# Patient Record
Sex: Female | Born: 1990 | Race: Asian | Hispanic: No | Marital: Married | Smoking: Never smoker
Health system: Southern US, Community
[De-identification: ages and names within clinical notes are randomized; demographics above are authoritative.]

## PROBLEM LIST (undated history)

## (undated) DIAGNOSIS — Z789 Other specified health status: Secondary | ICD-10-CM

## (undated) DIAGNOSIS — M069 Rheumatoid arthritis, unspecified: Secondary | ICD-10-CM

## (undated) HISTORY — PX: NO PAST SURGERIES: SHX2092

## (undated) HISTORY — DX: Other specified health status: Z78.9

## (undated) HISTORY — DX: Rheumatoid arthritis, unspecified: M06.9

---
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
## 2009-01-24 ENCOUNTER — Ambulatory Visit (HOSPITAL_COMMUNITY): Admission: RE | Admit: 2009-01-24 | Discharge: 2009-01-24 | Payer: Self-pay | Admitting: Obstetrics & Gynecology

## 2009-03-12 ENCOUNTER — Encounter: Payer: Self-pay | Admitting: Family Medicine

## 2009-03-12 ENCOUNTER — Ambulatory Visit (HOSPITAL_COMMUNITY): Admission: RE | Admit: 2009-03-12 | Discharge: 2009-03-12 | Payer: Self-pay | Admitting: Family Medicine

## 2009-03-17 ENCOUNTER — Ambulatory Visit: Payer: Self-pay | Admitting: Advanced Practice Midwife

## 2009-03-17 ENCOUNTER — Inpatient Hospital Stay (HOSPITAL_COMMUNITY): Admission: AD | Admit: 2009-03-17 | Discharge: 2009-03-17 | Payer: Self-pay | Admitting: Obstetrics & Gynecology

## 2009-03-20 ENCOUNTER — Ambulatory Visit: Payer: Self-pay | Admitting: Obstetrics & Gynecology

## 2009-03-20 ENCOUNTER — Encounter: Payer: Self-pay | Admitting: Obstetrics & Gynecology

## 2009-03-27 ENCOUNTER — Ambulatory Visit (HOSPITAL_COMMUNITY): Admission: RE | Admit: 2009-03-27 | Discharge: 2009-03-27 | Payer: Self-pay | Admitting: Obstetrics & Gynecology

## 2009-03-27 ENCOUNTER — Ambulatory Visit: Payer: Self-pay | Admitting: Obstetrics & Gynecology

## 2009-03-31 ENCOUNTER — Ambulatory Visit: Payer: Self-pay | Admitting: Obstetrics and Gynecology

## 2009-03-31 ENCOUNTER — Inpatient Hospital Stay (HOSPITAL_COMMUNITY): Admission: AD | Admit: 2009-03-31 | Discharge: 2009-04-02 | Payer: Self-pay | Admitting: Obstetrics & Gynecology

## 2009-03-31 ENCOUNTER — Encounter: Payer: Self-pay | Admitting: Obstetrics & Gynecology

## 2010-02-01 ENCOUNTER — Encounter: Payer: Self-pay | Admitting: *Deleted

## 2010-04-06 LAB — COMPREHENSIVE METABOLIC PANEL
Albumin: 3.3 g/dL — ABNORMAL LOW (ref 3.5–5.2)
BUN: 8 mg/dL (ref 6–23)
CO2: 21 mEq/L (ref 19–32)
Chloride: 105 mEq/L (ref 96–112)
Creatinine, Ser: 0.47 mg/dL (ref 0.4–1.2)
GFR calc Af Amer: >60 mL/min (ref >60)
Sodium: 134 mEq/L — ABNORMAL LOW (ref 135–145)

## 2010-04-06 LAB — POCT URINALYSIS DIP (DEVICE)
Bilirubin Urine: NEGATIVE
Glucose, UA: NEGATIVE mg/dL
Hgb urine dipstick: NEGATIVE
Hgb urine dipstick: NEGATIVE
Ketones, ur: NEGATIVE mg/dL
Nitrite: NEGATIVE
Protein, ur: NEGATIVE mg/dL
Protein, ur: NEGATIVE mg/dL
Specific Gravity, Urine: 1.015 (ref 1.005–1.030)
Urobilinogen, UA: 0.2 mg/dL (ref 0.0–1.0)
pH: 7 (ref 5.0–8.0)

## 2010-04-06 LAB — CBC
HCT: 35.2 % — ABNORMAL LOW (ref 36.0–46.0)
MCHC: 34.5 g/dL (ref 30.0–36.0)
MCV: 94.2 fL (ref 78.0–100.0)
Platelets: 157 10*3/uL (ref 150–400)
RBC: 3.73 MIL/uL — ABNORMAL LOW (ref 3.87–5.11)
RDW: 13.9 % (ref 11.5–15.5)
RDW: 14.1 % (ref 11.5–15.5)
WBC: 9 10*3/uL (ref 4.0–10.5)

## 2010-04-06 LAB — URIC ACID: Uric Acid, Serum: 5.5 mg/dL (ref 2.4–7.0)

## 2010-04-27 LAB — POCT URINALYSIS DIP (DEVICE)
Bilirubin Urine: NEGATIVE
Glucose, UA: NEGATIVE mg/dL
Nitrite: NEGATIVE

## 2010-04-27 LAB — WET PREP, GENITAL
Trich, Wet Prep: NONE SEEN
Yeast Wet Prep HPF POC: NONE SEEN

## 2010-04-27 LAB — GC/CHLAMYDIA PROBE AMP, GENITAL: Chlamydia, DNA Probe: NEGATIVE

## 2010-04-27 LAB — POCT PREGNANCY, URINE: Preg Test, Ur: NEGATIVE

## 2010-05-29 NOTE — Op Note (Signed)
Victoria Fitzpatrick, SEM NO.:  [id number]   MEDICAL RECORD NO.:  [id number]          PATIENT TYPE:  AMB   LOCATION:  DSC                          FACILITY:  [facility]   PHYSICIAN:  Leonia Corona, M.D.  DATE OF BIRTH:  1990/08/12   DATE OF PROCEDURE:  05/19/2005  DATE OF DISCHARGE:                                 OPERATIVE REPORT   PREOPERATIVE DIAGNOSIS:  Nonresolving cervical lymphadenitis.   POSTOPERATIVE DIAGNOSIS:  Nonresolving cervical lymphadenitis.   OPERATION PERFORMED:  Diagnostic lymph node biopsy from right cervical  region.   SURGEON:  Leonia Corona, M.D.   ANESTHESIA:  General laryngeal mask.   ASSISTANT:  Nurse.   INDICATIONS FOR PROCEDURE:  This 20 year old girl recently admitted and was  seen for nonresolving right cervical lymphadenitis. There were clumps of  lymph nodes in the lower right cervical chain.  Suspicion of tuberculosis  was made, hence the indication for the procedure.   DESCRIPTION OF PROCEDURE:  The patient was brought to the operating room and  placed supine on the operating table. General laryngeal mask anesthesia was  given.  Rolled sheet was placed under the shoulder to make the right  cervical lymph node prominent.  The neck was turned to the left side.  The  area was cleaned, prepped and draped in usual manner.  Transverse skin  crease incision right above the palpable lymph node in the lower cervical  chain was made measuring about 2 cm.  The incision was deepened through the  subcutaneous tissue and the platysma.  The sternocleidomastoid muscle was  retracted anteriorly and to get to the lymph node which was carefully  dissected, there appeared to be dense periadenitis.  Careful dissection was  carried out.  The multiple lymph nodes were matted together.  During the  procedure, the lymph node started caseating and caseation material came out  and swabs were obtained for aerobic cultures.  Blunt and sharp dissection  was carried out carefully close to the lymph node and two to three lymph  nodes with multiple fragments were taken out and removed from the field,  sent for biopsy and there was no bleeding or oozing in the wound which was  irrigated and cleaned and dry.  Approximately 5 mL of 0.25% Marcaine with  epinephrine was infiltrated in and around the incision for postoperative  pain control.  The wound was closed in two layers, the deep platysmal layer  using 5-0 Vicryl and the  skin with 5-0 Prolene pull-thru stitch.  Steri-Strips were applied which was  covered with sterile gauze and Tegaderm dressing.  The patient tolerated the  procedure very well, which was smooth and uneventful.  The patient was later  extubated and transported to recovery room in good stable condition.      Leonia Corona, M.D.  Electronically Signed     SF/MEDQ  D:  05/19/2005  T:  05/20/2005  Job:  [id number]   cc:   Marthenia Rolling, MD  [address] [place], [place] [address]

## 2013-01-11 NOTE — L&D Delivery Note (Signed)
Delivery Note  At 5:05 PM a viable female was delivered via  (Presentation: LOA with compound left hand and nuchal cord ;  ).  APGAR: 9, 9; weight  .   Placenta status: Intact, Spontaneous.  Cord:  with the following complications: .  Cord pH: NA  Anesthesia: Epidural Episiotomy:  None   Lacerations:  Second degree midline perineal Suture Repair: 3.0 vicryl Est. Blood Loss (mL): 350    Mom to postpartum.  Baby to Couplet care / Skin to Skin.  Tawnya Crook 01/07/2014, 5:41 PM

## 2013-05-22 ENCOUNTER — Encounter: Payer: Self-pay | Admitting: *Deleted

## 2013-05-22 ENCOUNTER — Ambulatory Visit (INDEPENDENT_AMBULATORY_CARE_PROVIDER_SITE_OTHER): Payer: Self-pay | Admitting: *Deleted

## 2013-05-22 DIAGNOSIS — Z3201 Encounter for pregnancy test, result positive: Secondary | ICD-10-CM

## 2013-05-22 DIAGNOSIS — Z3200 Encounter for pregnancy test, result unknown: Secondary | ICD-10-CM

## 2013-05-22 LAB — POCT PREGNANCY, URINE: Preg Test, Ur: POSITIVE — AB

## 2013-05-22 NOTE — Progress Notes (Signed)
Pregnancy test:  POSITIVE, letter of verification given, LMP 04/22/2013 [facility] 01/27/2014.  Pt is unsure if she will receive care at this clinic, needs to apply for Medicaid.

## 2013-08-15 ENCOUNTER — Encounter: Payer: Self-pay | Admitting: Family

## 2013-08-15 ENCOUNTER — Ambulatory Visit (INDEPENDENT_AMBULATORY_CARE_PROVIDER_SITE_OTHER): Payer: Medicaid Other | Admitting: Family

## 2013-08-15 ENCOUNTER — Other Ambulatory Visit (HOSPITAL_COMMUNITY)
Admission: RE | Admit: 2013-08-15 | Discharge: 2013-08-15 | Disposition: A | Discharge status: Home / Self Care | Payer: Medicaid Other | Source: Ambulatory Visit | Attending: Family | Admitting: Family

## 2013-08-15 VITALS — BP 120/69 | HR 79 | Temp 98.4°F | Ht 60.0 in | Wt 107.5 lb

## 2013-08-15 DIAGNOSIS — Z01419 Encounter for gynecological examination (general) (routine) without abnormal findings: Secondary | ICD-10-CM | POA: Insufficient documentation

## 2013-08-15 DIAGNOSIS — Z3482 Encounter for supervision of other normal pregnancy, second trimester: Secondary | ICD-10-CM

## 2013-08-15 DIAGNOSIS — Z113 Encounter for screening for infections with a predominantly sexual mode of transmission: Secondary | ICD-10-CM | POA: Diagnosis present

## 2013-08-15 DIAGNOSIS — Z3490 Encounter for supervision of normal pregnancy, unspecified, unspecified trimester: Secondary | ICD-10-CM | POA: Insufficient documentation

## 2013-08-15 DIAGNOSIS — Z3480 Encounter for supervision of other normal pregnancy, unspecified trimester: Secondary | ICD-10-CM

## 2013-08-15 LAB — POCT URINALYSIS DIP (DEVICE)
Bilirubin Urine: NEGATIVE
GLUCOSE, UA: NEGATIVE mg/dL
Hgb urine dipstick: NEGATIVE
Nitrite: NEGATIVE
Protein, ur: NEGATIVE mg/dL
Specific Gravity, Urine: 1.02 (ref 1.005–1.030)
UROBILINOGEN UA: 0.2 mg/dL (ref 0.0–1.0)
pH: 5.5 (ref 5.0–8.0)

## 2013-08-15 MED ORDER — PRENATAL VITAMINS 0.8 MG PO TABS: 1.0000 | ORAL_TABLET | Freq: Every day | ORAL | Status: DC

## 2013-08-15 NOTE — Patient Instructions (Signed)
Second Trimester of Pregnancy The second trimester is from week 13 through week 28, months 4 through 6. The second trimester is often a time when you feel your best. Your body has also adjusted to being pregnant, and you begin to feel better physically. Usually, morning sickness has lessened or quit completely, you may have more energy, and you may have an increase in appetite. The second trimester is also a time when the fetus is growing rapidly. At the end of the sixth month, the fetus is about 9 inches long and weighs about 1 pounds. You will likely begin to feel the baby move (quickening) between 18 and 20 weeks of the pregnancy. BODY CHANGES Your body goes through many changes during pregnancy. The changes vary from woman to woman.   Your weight will continue to increase. You will notice your lower abdomen bulging out.  You may begin to get stretch marks on your hips, abdomen, and breasts.  You may develop headaches that can be relieved by medicines approved by your health care provider.  You may urinate more often because the fetus is pressing on your bladder.  You may develop or continue to have heartburn as a result of your pregnancy.  You may develop constipation because certain hormones are causing the muscles that push waste through your intestines to slow down.  You may develop hemorrhoids or swollen, bulging veins (varicose veins).  You may have back pain because of the weight gain and pregnancy hormones relaxing your joints between the bones in your pelvis and as a result of a shift in weight and the muscles that support your balance.  Your breasts will continue to grow and be tender.  Your gums may bleed and may be sensitive to brushing and flossing.  Dark spots or blotches (chloasma, mask of pregnancy) may develop on your face. This will likely fade after the baby is born.  A dark line from your belly button to the pubic area (linea nigra) may appear. This will likely fade  after the baby is born.  You may have changes in your hair. These can include thickening of your hair, rapid growth, and changes in texture. Some women also have hair loss during or after pregnancy, or hair that feels dry or thin. Your hair will most likely return to normal after your baby is born. WHAT TO EXPECT AT YOUR PRENATAL VISITS During a routine prenatal visit:  You will be weighed to make sure you and the fetus are growing normally.  Your blood pressure will be taken.  Your abdomen will be measured to track your baby's growth.  The fetal heartbeat will be listened to.  Any test results from the previous visit will be discussed. Your health care provider may ask you:  How you are feeling.  If you are feeling the baby move.  If you have had any abnormal symptoms, such as leaking fluid, bleeding, severe headaches, or abdominal cramping.  If you have any questions. Other tests that may be performed during your second trimester include:  Blood tests that check for:  Low iron levels (anemia).  Gestational diabetes (between 24 and 28 weeks).  Rh antibodies.  Urine tests to check for infections, diabetes, or protein in the urine.  An ultrasound to confirm the proper growth and development of the baby.  An amniocentesis to check for possible genetic problems.  Fetal screens for spina bifida and Down syndrome. HOME CARE INSTRUCTIONS   Avoid all smoking, herbs, alcohol, and unprescribed   drugs. These chemicals affect the formation and growth of the baby.  Follow your health care provider's instructions regarding medicine use. There are medicines that are either safe or unsafe to take during pregnancy.  Exercise only as directed by your health care provider. Experiencing uterine cramps is a good sign to stop exercising.  Continue to eat regular, healthy meals.  Wear a good support bra for breast tenderness.  Do not use hot tubs, steam rooms, or saunas.  Wear your  seat belt at all times when driving.  Avoid raw meat, uncooked cheese, cat litter boxes, and soil used by cats. These carry germs that can cause birth defects in the baby.  Take your prenatal vitamins.  Try taking a stool softener (if your health care provider approves) if you develop constipation. Eat more high-fiber foods, such as fresh vegetables or fruit and whole grains. Drink plenty of fluids to keep your urine clear or pale yellow.  Take warm sitz baths to soothe any pain or discomfort caused by hemorrhoids. Use hemorrhoid cream if your health care provider approves.  If you develop varicose veins, wear support hose. Elevate your feet for 15 minutes, 3-4 times a day. Limit salt in your diet.  Avoid heavy lifting, wear low heel shoes, and practice good posture.  Rest with your legs elevated if you have leg cramps or low back pain.  Visit your dentist if you have not gone yet during your pregnancy. Use a soft toothbrush to brush your teeth and be gentle when you floss.  A sexual relationship may be continued unless your health care provider directs you otherwise.  Continue to go to all your prenatal visits as directed by your health care provider. SEEK MEDICAL CARE IF:   You have dizziness.  You have mild pelvic cramps, pelvic pressure, or nagging pain in the abdominal area.  You have persistent nausea, vomiting, or diarrhea.  You have a bad smelling vaginal discharge.  You have pain with urination. SEEK IMMEDIATE MEDICAL CARE IF:   You have a fever.  You are leaking fluid from your vagina.  You have spotting or bleeding from your vagina.  You have severe abdominal cramping or pain.  You have rapid weight gain or loss.  You have shortness of breath with chest pain.  You notice sudden or extreme swelling of your face, hands, ankles, feet, or legs.  You have not felt your baby move in over an hour.  You have severe headaches that do not go away with  medicine.  You have vision changes. Document Released: 12/22/2000 Document Revised: 01/02/2013 Document Reviewed: 02/29/2012 Exit[employer] Patient Information 2015 [employer]. This information is not intended to replace advice given to you by your health care provider. Make sure you discuss any questions you have with your health care provider.  

## 2013-08-15 NOTE — Progress Notes (Signed)
   Subjective:    Victoria Fitzpatrick is a G2P1001 [redacted]w[redacted]d being seen today for her first obstetrical visit.   Patient does intend to breast feed. Pregnancy history fully reviewed.  Patient reports no complaints.  Filed Vitals:   08/15/13 1343 08/15/13 1346  BP: 120/69   Pulse: 79   Temp: 98.4 F (36.9 C)   Height:  5' (1.524 m)  Weight: 107 lb 8 oz (48.762 kg)     HISTORY: OB History  Gravida Para Term Preterm AB SAB TAB Ectopic Multiple Living  2 1 1       1     # Outcome Date GA Lbr Len/2nd Weight Sex Delivery Anes PTL Lv  2 CUR           1 TRM 03/31/09 [redacted]w[redacted]d  4 lb 8 oz (2.041 kg) M VAC None N Y     Comments: No complications     Past Medical History  Diagnosis Date  . Medical history non-contributory    Past Surgical History  Procedure Laterality Date  . No past surgeries     History reviewed. No pertinent family history.   Exam     Exam   BP 120/69  Pulse 79  Temp(Src) 98.4 F (36.9 C)  Ht 5' (1.524 m)  Wt 107 lb 8 oz (48.762 kg)  BMI 20.99 kg/m2  LMP 04/19/2013 Uterine Size: size equals dates  Pelvic Exam:    Perineum: No Hemorrhoids, Normal Perineum   Vulva: normal   Vagina:  normal mucosa, yellow-greenish vaginal discharge, no palpable nodules   pH: Not done   Cervix: Scant amount following Pap, no cervical motion tenderness and no lesions   Adnexa: normal adnexa and no mass, fullness, tenderness   Bony Pelvis: Adequate  System: Breast:  No nipple retraction or dimpling, No nipple discharge or bleeding, No axillary or supraclavicular adenopathy, Normal to palpation without dominant masses   Skin: normal coloration and turgor, no rashes    Neurologic: negative   Extremities: normal strength, tone, and muscle mass   HEENT neck supple with midline trachea and thyroid without masses   Mouth/Teeth mucous membranes moist, pharynx normal without lesions   Neck supple and no masses   Cardiovascular: regular rate and rhythm, no murmurs or gallops   Respiratory:  appears well, vitals normal, no respiratory distress, acyanotic, normal RR, neck free of mass or lymphadenopathy, chest clear, no wheezing, crepitations, rhonchi, normal symmetric air entry   Abdomen: soft, non-tender; bowel sounds normal; no masses,  no organomegaly   Urinary: urethral meatus normal    Assessment:    Pregnancy:  23 yo G2P1001 at [redacted]w[redacted]d wks IUP Patient Active Problem List   Diagnosis Date Noted  . Supervision of normal pregnancy, antepartum 08/15/2013  Abnormal Vaginal Discharge      Plan:     Initial labs drawn. Wet prep collected; Pap smear with GC/CT Prenatal vitamins. Problem list reviewed and updated. Genetic Screening discussed Quad Screen: ordered.  Ultrasound discussed; fetal survey: ordered.  Follow up in 4 weeks.    10/15/2013 08/15/2013

## 2013-08-16 LAB — WET PREP, GENITAL
CLUE CELLS WET PREP: NONE SEEN
Trich, Wet Prep: NONE SEEN
WBC, Wet Prep HPF POC: NONE SEEN
Yeast Wet Prep HPF POC: NONE SEEN

## 2013-08-16 LAB — OBSTETRIC PANEL
Antibody Screen: NEGATIVE
BASOS PCT: 0 % (ref 0–1)
Basophils Absolute: 0 10*3/uL (ref 0.0–0.1)
Eosinophils Absolute: 0 10*3/uL (ref 0.0–0.7)
Eosinophils Relative: 0 % (ref 0–5)
HCT: 39.6 % (ref 36.0–46.0)
HEP B S AG: NEGATIVE
Hemoglobin: 13.4 g/dL (ref 12.0–15.0)
LYMPHS PCT: 21 % (ref 12–46)
Lymphs Abs: 1.5 10*3/uL (ref 0.7–4.0)
MCH: 29.1 pg (ref 26.0–34.0)
MCHC: 33.8 g/dL (ref 30.0–36.0)
MCV: 86.1 fL (ref 78.0–100.0)
MONOS PCT: 8 % (ref 3–12)
Monocytes Absolute: 0.6 10*3/uL (ref 0.1–1.0)
NEUTROS ABS: 5.2 10*3/uL (ref 1.7–7.7)
NEUTROS PCT: 71 % (ref 43–77)
Platelets: 240 10*3/uL (ref 150–400)
RBC: 4.6 MIL/uL (ref 3.87–5.11)
RDW: 14.1 % (ref 11.5–15.5)
RH TYPE: POSITIVE
Rubella: 11.3 Index — ABNORMAL HIGH (ref <0.90)
WBC: 7.3 10*3/uL (ref 4.0–10.5)

## 2013-08-16 LAB — AFP, QUAD SCREEN
AFP: 44.5 [IU]/mL
Age Alone: 1:1090 {titer}
Curr Gest Age: 16.6 wks.days
Down Syndrome Scr Risk Est: <1:38500 {titer}
HCG, Total: 33393 m[IU]/mL
INH: 137 pg/mL
INTERPRETATION-AFP: NEGATIVE
MoM for AFP: 1.17
MoM for INH: 0.65
MoM for hCG: 1.44
OPEN SPINA BIFIDA: NEGATIVE
Osb Risk: 1:7440 {titer}
Tri 18 Scr Risk Est: NEGATIVE
Trisomy 18 (Edward) Syndrome Interp.: <1:113000 {titer}
UE3 MOM: 1.51
UE3 VALUE: 1 ng/mL

## 2013-08-16 LAB — CYTOLOGY - PAP

## 2013-08-16 LAB — HIV ANTIBODY (ROUTINE TESTING W REFLEX): HIV 1&2 Ab, 4th Generation: NONREACTIVE

## 2013-08-17 LAB — HEMOGLOBINOPATHY EVALUATION
HGB S QUANTITAION: 0 %
Hemoglobin Other: 0 %
Hgb A2 Quant: 2.9 % (ref 2.2–3.2)
Hgb A: 97.1 % (ref 96.8–97.8)
Hgb F Quant: 0 % (ref 0.0–2.0)

## 2013-08-17 LAB — PRESCRIPTION MONITORING PROFILE (19 PANEL)
Amphetamine/Meth: NEGATIVE ng/mL
BARBITURATE SCREEN, URINE: NEGATIVE ng/mL
Benzodiazepine Screen, Urine: NEGATIVE ng/mL
Buprenorphine, Urine: NEGATIVE ng/mL
CANNABINOID SCRN UR: NEGATIVE ng/mL
CREATININE, URINE: 122.2 mg/dL (ref >20.0)
Carisoprodol, Urine: NEGATIVE ng/mL
Cocaine Metabolites: NEGATIVE ng/mL
FENTANYL URINE: NEGATIVE ng/mL
MDMA URINE: NEGATIVE ng/mL
MEPERIDINE UR: NEGATIVE ng/mL
Methadone Screen, Urine: NEGATIVE ng/mL
Methaqualone: NEGATIVE ng/mL
Nitrites, Initial: NEGATIVE ug/mL
OPIATE SCREEN, URINE: NEGATIVE ng/mL
OXYCODONE SCRN UR: NEGATIVE ng/mL
PHENCYCLIDINE, UR: NEGATIVE ng/mL
Propoxyphene: NEGATIVE ng/mL
TRAMADOL UR: NEGATIVE ng/mL
Tapentadol, urine: NEGATIVE ng/mL
Zolpidem, Urine: NEGATIVE ng/mL
pH, Initial: 6 pH (ref 4.5–8.9)

## 2013-08-17 LAB — CULTURE, OB URINE
Colony Count: NO GROWTH
ORGANISM ID, BACTERIA: NO GROWTH

## 2013-08-19 ENCOUNTER — Encounter: Payer: Self-pay | Admitting: Family

## 2013-08-30 ENCOUNTER — Ambulatory Visit (HOSPITAL_COMMUNITY)
Admission: RE | Admit: 2013-08-30 | Discharge: 2013-08-30 | Disposition: A | Discharge status: Home / Self Care | Payer: Medicaid Other | Source: Ambulatory Visit | Attending: Family | Admitting: Family

## 2013-08-30 DIAGNOSIS — Z363 Encounter for antenatal screening for malformations: Secondary | ICD-10-CM | POA: Insufficient documentation

## 2013-08-30 DIAGNOSIS — Z1389 Encounter for screening for other disorder: Secondary | ICD-10-CM | POA: Diagnosis present

## 2013-08-30 DIAGNOSIS — Z3482 Encounter for supervision of other normal pregnancy, second trimester: Secondary | ICD-10-CM

## 2013-09-12 ENCOUNTER — Ambulatory Visit (INDEPENDENT_AMBULATORY_CARE_PROVIDER_SITE_OTHER): Payer: Medicaid Other | Admitting: Advanced Practice Midwife

## 2013-09-12 ENCOUNTER — Encounter: Payer: Self-pay | Admitting: Advanced Practice Midwife

## 2013-09-12 VITALS — BP 103/63 | HR 78 | Temp 97.4°F | Wt 112.7 lb

## 2013-09-12 DIAGNOSIS — Z3482 Encounter for supervision of other normal pregnancy, second trimester: Secondary | ICD-10-CM

## 2013-09-12 DIAGNOSIS — Z3480 Encounter for supervision of other normal pregnancy, unspecified trimester: Secondary | ICD-10-CM

## 2013-09-12 LAB — POCT URINALYSIS DIP (DEVICE)
Bilirubin Urine: NEGATIVE
Glucose, UA: NEGATIVE mg/dL
Hgb urine dipstick: NEGATIVE
KETONES UR: NEGATIVE mg/dL
Nitrite: NEGATIVE
PH: 7 (ref 5.0–8.0)
Protein, ur: NEGATIVE mg/dL
SPECIFIC GRAVITY, URINE: 1.015 (ref 1.005–1.030)
Urobilinogen, UA: 0.2 mg/dL (ref 0.0–1.0)

## 2013-09-12 MED ORDER — PRENATAL VITAMINS 0.8 MG PO TABS: 1.0000 | ORAL_TABLET | Freq: Every day | ORAL | Status: DC

## 2013-09-12 NOTE — Progress Notes (Signed)
Patient is feeling well with no complaints.  No bleeding/pain/contractions.  Prescription printed for PNV.  [place] reviewed with normal anatomy.

## 2013-09-12 NOTE — Progress Notes (Signed)
Seen also by me. Printed Rx for vitamins

## 2013-09-12 NOTE — Patient Instructions (Signed)
Second Trimester of Pregnancy The second trimester is from week 13 through week 28, months 4 through 6. The second trimester is often a time when you feel your best. Your body has also adjusted to being pregnant, and you begin to feel better physically. Usually, morning sickness has lessened or quit completely, you may have more energy, and you may have an increase in appetite. The second trimester is also a time when the fetus is growing rapidly. At the end of the sixth month, the fetus is about 9 inches long and weighs about 1 pounds. You will likely begin to feel the baby move (quickening) between 18 and 20 weeks of the pregnancy. BODY CHANGES Your body goes through many changes during pregnancy. The changes vary from woman to woman.   Your weight will continue to increase. You will notice your lower abdomen bulging out.  You may begin to get stretch marks on your hips, abdomen, and breasts.  You may develop headaches that can be relieved by medicines approved by your health care provider.  You may urinate more often because the fetus is pressing on your bladder.  You may develop or continue to have heartburn as a result of your pregnancy.  You may develop constipation because certain hormones are causing the muscles that push waste through your intestines to slow down.  You may develop hemorrhoids or swollen, bulging veins (varicose veins).  You may have back pain because of the weight gain and pregnancy hormones relaxing your joints between the bones in your pelvis and as a result of a shift in weight and the muscles that support your balance.  Your breasts will continue to grow and be tender.  Your gums may bleed and may be sensitive to brushing and flossing.  Dark spots or blotches (chloasma, mask of pregnancy) may develop on your face. This will likely fade after the baby is born.  A dark line from your belly button to the pubic area (linea nigra) may appear. This will likely fade  after the baby is born.  You may have changes in your hair. These can include thickening of your hair, rapid growth, and changes in texture. Some women also have hair loss during or after pregnancy, or hair that feels dry or thin. Your hair will most likely return to normal after your baby is born. WHAT TO EXPECT AT YOUR PRENATAL VISITS During a routine prenatal visit:  You will be weighed to make sure you and the fetus are growing normally.  Your blood pressure will be taken.  Your abdomen will be measured to track your baby's growth.  The fetal heartbeat will be listened to.  Any test results from the previous visit will be discussed. Your health care provider may ask you:  How you are feeling.  If you are feeling the baby move.  If you have had any abnormal symptoms, such as leaking fluid, bleeding, severe headaches, or abdominal cramping.  If you have any questions. Other tests that may be performed during your second trimester include:  Blood tests that check for:  Low iron levels (anemia).  Gestational diabetes (between 24 and 28 weeks).  Rh antibodies.  Urine tests to check for infections, diabetes, or protein in the urine.  An ultrasound to confirm the proper growth and development of the baby.  An amniocentesis to check for possible genetic problems.  Fetal screens for spina bifida and Down syndrome. HOME CARE INSTRUCTIONS   Avoid all smoking, herbs, alcohol, and unprescribed   drugs. These chemicals affect the formation and growth of the baby.  Follow your health care provider's instructions regarding medicine use. There are medicines that are either safe or unsafe to take during pregnancy.  Exercise only as directed by your health care provider. Experiencing uterine cramps is a good sign to stop exercising.  Continue to eat regular, healthy meals.  Wear a good support bra for breast tenderness.  Do not use hot tubs, steam rooms, or saunas.  Wear your  seat belt at all times when driving.  Avoid raw meat, uncooked cheese, cat litter boxes, and soil used by cats. These carry germs that can cause birth defects in the baby.  Take your prenatal vitamins.  Try taking a stool softener (if your health care provider approves) if you develop constipation. Eat more high-fiber foods, such as fresh vegetables or fruit and whole grains. Drink plenty of fluids to keep your urine clear or pale yellow.  Take warm sitz baths to soothe any pain or discomfort caused by hemorrhoids. Use hemorrhoid cream if your health care provider approves.  If you develop varicose veins, wear support hose. Elevate your feet for 15 minutes, 3-4 times a day. Limit salt in your diet.  Avoid heavy lifting, wear low heel shoes, and practice good posture.  Rest with your legs elevated if you have leg cramps or low back pain.  Visit your dentist if you have not gone yet during your pregnancy. Use a soft toothbrush to brush your teeth and be gentle when you floss.  A sexual relationship may be continued unless your health care provider directs you otherwise.  Continue to go to all your prenatal visits as directed by your health care provider. SEEK MEDICAL CARE IF:   You have dizziness.  You have mild pelvic cramps, pelvic pressure, or nagging pain in the abdominal area.  You have persistent nausea, vomiting, or diarrhea.  You have a bad smelling vaginal discharge.  You have pain with urination. SEEK IMMEDIATE MEDICAL CARE IF:   You have a fever.  You are leaking fluid from your vagina.  You have spotting or bleeding from your vagina.  You have severe abdominal cramping or pain.  You have rapid weight gain or loss.  You have shortness of breath with chest pain.  You notice sudden or extreme swelling of your face, hands, ankles, feet, or legs.  You have not felt your baby move in over an hour.  You have severe headaches that do not go away with  medicine.  You have vision changes. Document Released: 12/22/2000 Document Revised: 01/02/2013 Document Reviewed: 02/29/2012 Exit[employer] Patient Information 2015 [employer]. This information is not intended to replace advice given to you by your health care provider. Make sure you discuss any questions you have with your health care provider.  

## 2013-09-15 ENCOUNTER — Encounter: Payer: Self-pay | Admitting: Family

## 2013-10-10 ENCOUNTER — Encounter: Payer: Self-pay | Admitting: Obstetrics & Gynecology

## 2013-10-10 ENCOUNTER — Ambulatory Visit (INDEPENDENT_AMBULATORY_CARE_PROVIDER_SITE_OTHER): Payer: Medicaid Other | Admitting: Advanced Practice Midwife

## 2013-10-10 VITALS — BP 105/71 | HR 85 | Wt 117.9 lb

## 2013-10-10 DIAGNOSIS — Z23 Encounter for immunization: Secondary | ICD-10-CM

## 2013-10-10 DIAGNOSIS — Z3480 Encounter for supervision of other normal pregnancy, unspecified trimester: Secondary | ICD-10-CM

## 2013-10-10 DIAGNOSIS — Z3483 Encounter for supervision of other normal pregnancy, third trimester: Secondary | ICD-10-CM

## 2013-10-10 LAB — POCT URINALYSIS DIP (DEVICE)
Bilirubin Urine: NEGATIVE
GLUCOSE, UA: NEGATIVE mg/dL
HGB URINE DIPSTICK: NEGATIVE
Ketones, ur: NEGATIVE mg/dL
NITRITE: NEGATIVE
PROTEIN: NEGATIVE mg/dL
SPECIFIC GRAVITY, URINE: 1.02 (ref 1.005–1.030)
Urobilinogen, UA: 0.2 mg/dL (ref 0.0–1.0)
pH: 5.5 (ref 5.0–8.0)

## 2013-10-10 NOTE — Addendum Note (Signed)
Addended by: Kathee Delton on: 10/10/2013 11:41 AM   Modules accepted: Orders

## 2013-10-10 NOTE — Patient Instructions (Signed)

## 2013-10-10 NOTE — Progress Notes (Signed)
Flu vaccine today. Active baby.

## 2013-10-31 ENCOUNTER — Ambulatory Visit (INDEPENDENT_AMBULATORY_CARE_PROVIDER_SITE_OTHER): Payer: Medicaid Other | Admitting: Advanced Practice Midwife

## 2013-10-31 VITALS — BP 116/74 | HR 74 | Temp 97.5°F | Wt 125.3 lb

## 2013-10-31 DIAGNOSIS — Z23 Encounter for immunization: Secondary | ICD-10-CM

## 2013-10-31 DIAGNOSIS — Z3483 Encounter for supervision of other normal pregnancy, third trimester: Secondary | ICD-10-CM

## 2013-10-31 LAB — CBC
HCT: 37.2 % (ref 36.0–46.0)
Hemoglobin: 12.5 g/dL (ref 12.0–15.0)
MCH: 30.3 pg (ref 26.0–34.0)
MCHC: 33.6 g/dL (ref 30.0–36.0)
MCV: 90.1 fL (ref 78.0–100.0)
PLATELETS: 232 10*3/uL (ref 150–400)
RBC: 4.13 MIL/uL (ref 3.87–5.11)
RDW: 14 % (ref 11.5–15.5)
WBC: 9.5 10*3/uL (ref 4.0–10.5)

## 2013-10-31 LAB — POCT URINALYSIS DIP (DEVICE)
Bilirubin Urine: NEGATIVE
Glucose, UA: NEGATIVE mg/dL
Hgb urine dipstick: NEGATIVE
Ketones, ur: NEGATIVE mg/dL
Leukocytes, UA: NEGATIVE
NITRITE: NEGATIVE
PH: 6 (ref 5.0–8.0)
PROTEIN: NEGATIVE mg/dL
SPECIFIC GRAVITY, URINE: 1.02 (ref 1.005–1.030)
UROBILINOGEN UA: 0.2 mg/dL (ref 0.0–1.0)

## 2013-10-31 MED ORDER — TETANUS-DIPHTH-ACELL PERTUSSIS 5-2.5-18.5 LF-MCG/0.5 IM SUSP: 0.5000 mL | Freq: Once | INTRAMUSCULAR | Status: DC

## 2013-10-31 MED ORDER — CONCEPT OB 130-92.4-1 MG PO CAPS: 1.0000 | ORAL_CAPSULE | Freq: Every day | ORAL | Status: DC

## 2013-10-31 NOTE — Progress Notes (Signed)
Pt states that she sometimes has tightening of the belly tdap vaccine consented and info given 28 week labs with glucola done today

## 2013-10-31 NOTE — Progress Notes (Signed)
1-2 UC's per day. PTL precautions. 28 weeks labs and TDaP today.

## 2013-10-31 NOTE — Patient Instructions (Signed)

## 2013-11-01 LAB — RPR

## 2013-11-01 LAB — GLUCOSE TOLERANCE, 1 HOUR (50G) W/O FASTING: Glucose, 1 Hour GTT: 67 mg/dL — ABNORMAL LOW (ref 70–140)

## 2013-11-01 LAB — HIV ANTIBODY (ROUTINE TESTING W REFLEX): HIV 1&2 Ab, 4th Generation: NONREACTIVE

## 2013-11-02 ENCOUNTER — Encounter: Payer: Self-pay | Admitting: Advanced Practice Midwife

## 2013-11-12 ENCOUNTER — Encounter: Payer: Self-pay | Admitting: Advanced Practice Midwife

## 2013-11-14 ENCOUNTER — Ambulatory Visit (INDEPENDENT_AMBULATORY_CARE_PROVIDER_SITE_OTHER): Payer: Medicaid Other | Admitting: Obstetrics and Gynecology

## 2013-11-14 ENCOUNTER — Encounter: Payer: Self-pay | Admitting: Obstetrics and Gynecology

## 2013-11-14 VITALS — BP 117/74 | HR 92 | Temp 98.3°F | Wt 127.0 lb

## 2013-11-14 DIAGNOSIS — O09293 Supervision of pregnancy with other poor reproductive or obstetric history, third trimester: Secondary | ICD-10-CM

## 2013-11-14 LAB — POCT URINALYSIS DIP (DEVICE)
BILIRUBIN URINE: NEGATIVE
Glucose, UA: NEGATIVE mg/dL
Hgb urine dipstick: NEGATIVE
Ketones, ur: NEGATIVE mg/dL
NITRITE: NEGATIVE
PH: 7.5 (ref 5.0–8.0)
PROTEIN: NEGATIVE mg/dL
Specific Gravity, Urine: 1.02 (ref 1.005–1.030)
UROBILINOGEN UA: 0.2 mg/dL (ref 0.0–1.0)

## 2013-11-14 NOTE — Progress Notes (Signed)
Doing well. Good FM P1: IOL for FGR and oligo. BW at 38 wks 4#8, NICU. No HTN. Willl schedule [place] interval growth.

## 2013-11-14 NOTE — Patient Instructions (Signed)
Third Trimester of Pregnancy The third trimester is from week 29 through week 42, months 7 through 9. The third trimester is a time when the fetus is growing rapidly. At the end of the ninth month, the fetus is about 20 inches in length and weighs 6-10 pounds.  BODY CHANGES Your body goes through many changes during pregnancy. The changes vary from woman to woman.   Your weight will continue to increase. You can expect to gain 25-35 pounds (11-16 kg) by the end of the pregnancy.  You may begin to get stretch marks on your hips, abdomen, and breasts.  You may urinate more often because the fetus is moving lower into your pelvis and pressing on your bladder.  You may develop or continue to have heartburn as a result of your pregnancy.  You may develop constipation because certain hormones are causing the muscles that push waste through your intestines to slow down.  You may develop hemorrhoids or swollen, bulging veins (varicose veins).  You may have pelvic pain because of the weight gain and pregnancy hormones relaxing your joints between the bones in your pelvis. Backaches may result from overexertion of the muscles supporting your posture.  You may have changes in your hair. These can include thickening of your hair, rapid growth, and changes in texture. Some women also have hair loss during or after pregnancy, or hair that feels dry or thin. Your hair will most likely return to normal after your baby is born.  Your breasts will continue to grow and be tender. A yellow discharge may leak from your breasts called colostrum.  Your belly button may stick out.  You may feel short of breath because of your expanding uterus.  You may notice the fetus "dropping," or moving lower in your abdomen.  You may have a bloody mucus discharge. This usually occurs a few days to a week before labor begins.  Your cervix becomes thin and soft (effaced) near your due date. WHAT TO EXPECT AT YOUR PRENATAL  EXAMS  You will have prenatal exams every 2 weeks until week 36. Then, you will have weekly prenatal exams. During a routine prenatal visit:  You will be weighed to make sure you and the fetus are growing normally.  Your blood pressure is taken.  Your abdomen will be measured to track your baby's growth.  The fetal heartbeat will be listened to.  Any test results from the previous visit will be discussed.  You may have a cervical check near your due date to see if you have effaced. At around 36 weeks, your caregiver will check your cervix. At the same time, your caregiver will also perform a test on the secretions of the vaginal tissue. This test is to determine if a type of bacteria, Group B streptococcus, is present. Your caregiver will explain this further. Your caregiver may ask you:  What your birth plan is.  How you are feeling.  If you are feeling the baby move.  If you have had any abnormal symptoms, such as leaking fluid, bleeding, severe headaches, or abdominal cramping.  If you have any questions. Other tests or screenings that may be performed during your third trimester include:  Blood tests that check for low iron levels (anemia).  Fetal testing to check the health, activity level, and growth of the fetus. Testing is done if you have certain medical conditions or if there are problems during the pregnancy. FALSE LABOR You may feel small, irregular contractions that   eventually go away. These are called Braxton Hicks contractions, or false labor. Contractions may last for hours, days, or even weeks before true labor sets in. If contractions come at regular intervals, intensify, or become painful, it is best to be seen by your caregiver.  SIGNS OF LABOR   Menstrual-like cramps.  Contractions that are 5 minutes apart or less.  Contractions that start on the top of the uterus and spread down to the lower abdomen and back.  A sense of increased pelvic pressure or back  pain.  A watery or bloody mucus discharge that comes from the vagina. If you have any of these signs before the 37th week of pregnancy, call your caregiver right away. You need to go to the hospital to get checked immediately. HOME CARE INSTRUCTIONS   Avoid all smoking, herbs, alcohol, and unprescribed drugs. These chemicals affect the formation and growth of the baby.  Follow your caregiver's instructions regarding medicine use. There are medicines that are either safe or unsafe to take during pregnancy.  Exercise only as directed by your caregiver. Experiencing uterine cramps is a good sign to stop exercising.  Continue to eat regular, healthy meals.  Wear a good support bra for breast tenderness.  Do not use hot tubs, steam rooms, or saunas.  Wear your seat belt at all times when driving.  Avoid raw meat, uncooked cheese, cat litter boxes, and soil used by cats. These carry germs that can cause birth defects in the baby.  Take your prenatal vitamins.  Try taking a stool softener (if your caregiver approves) if you develop constipation. Eat more high-fiber foods, such as fresh vegetables or fruit and whole grains. Drink plenty of fluids to keep your urine clear or pale yellow.  Take warm sitz baths to soothe any pain or discomfort caused by hemorrhoids. Use hemorrhoid cream if your caregiver approves.  If you develop varicose veins, wear support hose. Elevate your feet for 15 minutes, 3-4 times a day. Limit salt in your diet.  Avoid heavy lifting, wear low heal shoes, and practice good posture.  Rest a lot with your legs elevated if you have leg cramps or low back pain.  Visit your dentist if you have not gone during your pregnancy. Use a soft toothbrush to brush your teeth and be gentle when you floss.  A sexual relationship may be continued unless your caregiver directs you otherwise.  Do not travel far distances unless it is absolutely necessary and only with the approval  of your caregiver.  Take prenatal classes to understand, practice, and ask questions about the labor and delivery.  Make a trial run to the hospital.  Pack your hospital bag.  Prepare the baby's nursery.  Continue to go to all your prenatal visits as directed by your caregiver. SEEK MEDICAL CARE IF:  You are unsure if you are in labor or if your water has broken.  You have dizziness.  You have mild pelvic cramps, pelvic pressure, or nagging pain in your abdominal area.  You have persistent nausea, vomiting, or diarrhea.  You have a bad smelling vaginal discharge.  You have pain with urination. SEEK IMMEDIATE MEDICAL CARE IF:   You have a fever.  You are leaking fluid from your vagina.  You have spotting or bleeding from your vagina.  You have severe abdominal cramping or pain.  You have rapid weight loss or gain.  You have shortness of breath with chest pain.  You notice sudden or extreme swelling   of your face, hands, ankles, feet, or legs.  You have not felt your baby move in over an hour.  You have severe headaches that do not go away with medicine.  You have vision changes. Document Released: 12/22/2000 Document Revised: 01/02/2013 Document Reviewed: 02/29/2012 [employer] Patient Information 2015 [employer]. This information is not intended to replace advice given to you by your health care provider. Make sure you discuss any questions you have with your health care provider.  

## 2013-11-21 ENCOUNTER — Ambulatory Visit (HOSPITAL_COMMUNITY)
Admission: RE | Admit: 2013-11-21 | Discharge: 2013-11-21 | Disposition: A | Discharge status: Home / Self Care | Payer: Medicaid Other | Source: Ambulatory Visit | Attending: Obstetrics and Gynecology | Admitting: Obstetrics and Gynecology

## 2013-11-21 DIAGNOSIS — Z3A30 30 weeks gestation of pregnancy: Secondary | ICD-10-CM | POA: Insufficient documentation

## 2013-11-21 DIAGNOSIS — O09293 Supervision of pregnancy with other poor reproductive or obstetric history, third trimester: Secondary | ICD-10-CM | POA: Insufficient documentation

## 2013-11-21 DIAGNOSIS — O09299 Supervision of pregnancy with other poor reproductive or obstetric history, unspecified trimester: Secondary | ICD-10-CM | POA: Insufficient documentation

## 2013-11-28 ENCOUNTER — Ambulatory Visit (INDEPENDENT_AMBULATORY_CARE_PROVIDER_SITE_OTHER): Payer: Medicaid Other | Admitting: Physician Assistant

## 2013-11-28 VITALS — BP 107/72 | HR 84 | Temp 97.9°F | Wt 129.3 lb

## 2013-11-28 DIAGNOSIS — Z3483 Encounter for supervision of other normal pregnancy, third trimester: Secondary | ICD-10-CM

## 2013-11-28 LAB — POCT URINALYSIS DIP (DEVICE)
Bilirubin Urine: NEGATIVE
Glucose, UA: NEGATIVE mg/dL
Hgb urine dipstick: NEGATIVE
KETONES UR: NEGATIVE mg/dL
Nitrite: NEGATIVE
PH: 7 (ref 5.0–8.0)
PROTEIN: NEGATIVE mg/dL
Specific Gravity, Urine: 1.02 (ref 1.005–1.030)
UROBILINOGEN UA: 0.2 mg/dL (ref 0.0–1.0)

## 2013-11-28 NOTE — Progress Notes (Signed)
31 weeks, stable with no complaints.  Pt has had a few Braxton-Hicks contractions but nothing severe.  She denies LOF, vaginal bleeding, dysuria.   Endorses good fetal movement.  PTL precautions discussed.  PNV qd RTC 2 weeks

## 2013-11-28 NOTE — Patient Instructions (Signed)
Preterm Labor Information Preterm labor is when labor starts at less than 37 weeks of pregnancy. The normal length of a pregnancy is 39 to 41 weeks. CAUSES Often, there is no identifiable underlying cause as to why a woman goes into preterm labor. One of the most common known causes of preterm labor is infection. Infections of the uterus, cervix, vagina, amniotic sac, bladder, kidney, or even the lungs (pneumonia) can cause labor to start. Other suspected causes of preterm labor include:   Urogenital infections, such as yeast infections and bacterial vaginosis.   Uterine abnormalities (uterine shape, uterine septum, fibroids, or bleeding from the placenta).   A cervix that has been operated on (it may fail to stay closed).   Malformations in the fetus.   Multiple gestations (twins, triplets, and so on).   Breakage of the amniotic sac.  RISK FACTORS  Having a previous history of preterm labor.   Having premature rupture of membranes (PROM).   Having a placenta that covers the opening of the cervix (placenta previa).   Having a placenta that separates from the uterus (placental abruption).   Having a cervix that is too weak to hold the fetus in the uterus (incompetent cervix).   Having too much fluid in the amniotic sac (polyhydramnios).   Taking illegal drugs or smoking while pregnant.   Not gaining enough weight while pregnant.   Being younger than 20 and older than 24 years old.   Having a low socioeconomic status.   Being African American. SYMPTOMS Signs and symptoms of preterm labor include:   Menstrual-like cramps, abdominal pain, or back pain.  Uterine contractions that are regular, as frequent as six in an hour, regardless of their intensity (may be mild or painful).  Contractions that start on the top of the uterus and spread down to the lower abdomen and back.   A sense of increased pelvic pressure.   A watery or bloody mucus discharge that  comes from the vagina.  TREATMENT Depending on the length of the pregnancy and other circumstances, your health care provider may suggest bed rest. If necessary, there are medicines that can be given to stop contractions and to mature the fetal lungs. If labor happens before 34 weeks of pregnancy, a prolonged hospital stay may be recommended. Treatment depends on the condition of both you and the fetus.  WHAT SHOULD YOU DO IF YOU THINK YOU ARE IN PRETERM LABOR? Call your health care provider right away. You will need to go to the hospital to get checked immediately. HOW CAN YOU PREVENT PRETERM LABOR IN FUTURE PREGNANCIES? You should:   Stop smoking if you smoke.  Maintain healthy weight gain and avoid chemicals and drugs that are not necessary.  Be watchful for any type of infection.  Inform your health care provider if you have a known history of preterm labor. Document Released: 03/20/2003 Document Revised: 08/30/2012 Document Reviewed: 01/31/2012 [facility] Patient Information 2015 [place], [place]. This information is not intended to replace advice given to you by your health care provider. Make sure you discuss any questions you have with your health care provider. Preterm Birth Preterm birth is a birth that happens before 37 weeks of pregnancy. Most pregnancies last about 39-41 weeks. Every week in the womb is important and is beneficial to the health of the infant. Infants born before 37 weeks of pregnancy are at a higher risk for complications. Depending on when the infant was born, he or she may be:  Late preterm.  Born between 32 weeks and 37 weeks of pregnancy.  Very preterm. Born at less than 32 weeks of pregnancy.  Extremely preterm. Born at less than 25 weeks of pregnancy. The earlier a baby is born, the more likely the child will have issues related to prematurity. Complications and problems that can be seen in infants born too early include:  Problems breathing (respiratory  distress syndrome).  Low birth weight.  Problems feeding.  Sleeping problems.  Yellowing of the skin (jaundice).  Infections such as pneumonia. Babies born very preterm or extremely preterm are at risk for more serious medical issues. These include:  More severe breathing issues.  Eyesight issues.  Brain development issues (intraventricular hemorrhage).  Behavioral and emotional development issues.  Growth and developmental delays.  Cerebral palsy.  Serious feeding or bowel complications (necrotizing enterocolitis). CAUSES  There are two broad categories of preterm birth.  Spontaneous preterm birth. This is a birth resulting from preterm labor (not medically induced) or preterm premature rupture of membranes (PPROM).  Indicated preterm birth. This is a birth resulting from labor being medically induced due to health, personal, or social reasons. RISK FACTORS Preterm birth may be related to certain medical conditions, lifestyle factors, or demographic factors encountered by the mother or fetus.  Medical conditions include:  Multiple gestations (twins, triplets, and so on).  Infection.  Diabetes.  Heart disease.  Kidney disease.  Cervical or uterine abnormalities.  Being underweight.  High blood pressure or preeclampsia.  Premature rupture of membranes (PROM).  Birth defects in the fetus.  Lifestyle factors include:  Poor prenatal care.  Poor nutrition or anemia.  Cigarette smoking.  Consuming alcohol.  High levels of stress and lack of social or emotional support.  Exposure to chemical or environmental toxins.  Substance abuse.  Demographic factors include:  African-American ethnicity.  Age (younger than 46 or older than 24 years of age).  Low socioeconomic status. Women with a history of preterm labor or who become pregnant within 27 months of giving birth are also at increased risk for preterm birth. DIAGNOSIS  Your health care  provider may request additional tests to diagnose underlying complications resulting from preterm birth. Tests on the infant may include:  Physical exam.  Blood tests.  Chest X-rays.  Heart-lung monitoring. TREATMENT  After birth, special care will be taken to assess any problems or complications for the infant. Supportive care will be provided for the infant. Treatment depends on what problems are present and any complications that develop. Some preterm infants are cared for in a neonatal intensive care unit. In general, care may include:  Maintaining temperature and oxygen in a clear heated box (baby isolette).  Monitoring the infant's heart rate, breathing, and level of oxygen in the blood.  Monitoring for signs of infection and, if needed, giving IV antibiotic medicine.  Inserting a feeding tube (nose, mouth) or giving IV nutrition if unable to feed.  Inserting a breathing tube (ventilation).  Respiration support (continuous positive airway pressure [CPAP] or oxygen). Treatment will change as the infant builds up strength and is able to breathe and eat on his or her own. For some infants, no special treatment is necessary. Parents may be educated on the potential health risks of prematurity to the infant. HOME CARE INSTRUCTIONS  Understand your infant's special conditions and needs. It may be reassuring to learn about infant CPR.  Monitor your infant in the car seat until he or she grows and matures. Infant car seats can cause breathing  difficulties for preterm infants.  Keep your infant warm. Dress your infant in layers and keep him or her away from drafts, especially in cold months of the year.  Wash your hands thoroughly after going to the bathroom or changing a diaper. Late preterm infants may be more prone to infection.  Follow all your health care provider's instructions for providing support and care to your preterm infant.  Get support from organizations and groups  that understand your challenges.  Follow up with your infant's health care provider as directed. Prevention There are some things you can do to help lower your risk of having a preterm infant in the future. These include:  Good prenatal care throughout the entire pregnancy. See a health care provider regularly for advice and tests.  Management of underlying medical conditions.  Proper self-care and lifestyle changes.  Proper diet and weight control.  Watching for signs of various infections. SEEK MEDICAL CARE IF:  Your infant has feeding difficulties.  Your infant has sleeping difficulties.  Your infant has breathing difficulties.  Your infant's skin starts to look yellow.  Your infant shows signs of infection, such as a stuffy nose, fever, crying, or bluish color of the skin. FOR MORE INFORMATION [employer]: www.marchofdimes.com [employer]atur[employer]org: www.prematurity.org Document Released: 03/20/2003 Document Revised: 10/18/2012 Document Reviewed: 07/27/2012 [facility] Patient Information 2015 [place], [place]. This information is not intended to replace advice given to you by your health care provider. Make sure you discuss any questions you have with your health care provider.

## 2013-12-12 ENCOUNTER — Ambulatory Visit (INDEPENDENT_AMBULATORY_CARE_PROVIDER_SITE_OTHER): Payer: Medicaid Other | Admitting: Physician Assistant

## 2013-12-12 VITALS — BP 119/80 | HR 98 | Temp 98.8°F | Wt 131.8 lb

## 2013-12-12 DIAGNOSIS — O368130 Decreased fetal movements, third trimester, not applicable or unspecified: Secondary | ICD-10-CM

## 2013-12-12 DIAGNOSIS — O368131 Decreased fetal movements, third trimester, fetus 1: Secondary | ICD-10-CM

## 2013-12-12 LAB — POCT URINALYSIS DIP (DEVICE)
Bilirubin Urine: NEGATIVE
Glucose, UA: NEGATIVE mg/dL
Hgb urine dipstick: NEGATIVE
Ketones, ur: NEGATIVE mg/dL
NITRITE: NEGATIVE
PROTEIN: NEGATIVE mg/dL
SPECIFIC GRAVITY, URINE: 1.02 (ref 1.005–1.030)
Urobilinogen, UA: 1 mg/dL (ref 0.0–1.0)
pH: 7 (ref 5.0–8.0)

## 2013-12-12 NOTE — Patient Instructions (Signed)
Preterm Labor Information °Preterm labor is when labor starts at less than 37 weeks of pregnancy. The normal length of a pregnancy is 39 to 41 weeks. °CAUSES °Often, there is no identifiable underlying cause as to why a woman goes into preterm labor. One of the most common known causes of preterm labor is infection. Infections of the uterus, cervix, vagina, amniotic sac, bladder, kidney, or even the lungs (pneumonia) can cause labor to start. Other suspected causes of preterm labor include:  °· Urogenital infections, such as yeast infections and bacterial vaginosis.   °· Uterine abnormalities (uterine shape, uterine septum, fibroids, or bleeding from the placenta).   °· A cervix that has been operated on (it may fail to stay closed).   °· Malformations in the fetus.   °· Multiple gestations (twins, triplets, and so on).   °· Breakage of the amniotic sac.   °RISK FACTORS °· Having a previous history of preterm labor.   °· Having premature rupture of membranes (PROM).   °· Having a placenta that covers the opening of the cervix (placenta previa).   °· Having a placenta that separates from the uterus (placental abruption).   °· Having a cervix that is too weak to hold the fetus in the uterus (incompetent cervix).   °· Having too much fluid in the amniotic sac (polyhydramnios).   °· Taking illegal drugs or smoking while pregnant.   °· Not gaining enough weight while pregnant.   °· Being younger than 18 and older than 24 years old.   °· Having a low socioeconomic status.   °· Being African American. °SYMPTOMS °Signs and symptoms of preterm labor include:  °· Menstrual-like cramps, abdominal pain, or back pain. °· Uterine contractions that are regular, as frequent as six in an hour, regardless of their intensity (may be mild or painful). °· Contractions that start on the top of the uterus and spread down to the lower abdomen and back.   °· A sense of increased pelvic pressure.   °· A watery or bloody mucus discharge that  comes from the vagina.   °TREATMENT °Depending on the length of the pregnancy and other circumstances, your health care provider may suggest bed rest. If necessary, there are medicines that can be given to stop contractions and to mature the fetal lungs. If labor happens before 34 weeks of pregnancy, a prolonged hospital stay may be recommended. Treatment depends on the condition of both you and the fetus.  °WHAT SHOULD YOU DO IF YOU THINK YOU ARE IN PRETERM LABOR? °Call your health care provider right away. You will need to go to the hospital to get checked immediately. °HOW CAN YOU PREVENT PRETERM LABOR IN FUTURE PREGNANCIES? °You should:  °· Stop smoking if you smoke.  °· Maintain healthy weight gain and avoid chemicals and drugs that are not necessary. °· Be watchful for any type of infection. °· Inform your health care provider if you have a known history of preterm labor. °Document Released: 03/20/2003 Document Revised: 08/30/2012 Document Reviewed: 01/31/2012 °[employer]® Patient Information ©2015 [employer]. This information is not intended to replace advice given to you by your health care provider. Make sure you discuss any questions you have with your health care provider. °Fetal Movement Counts °Patient Name: __________________________________________________ Patient Due Date: ____________________ °Performing a fetal movement count is highly recommended in high-risk pregnancies, but it is good for every pregnant woman to do. Your health care provider may ask you to start counting fetal movements at 28 weeks of the pregnancy. Fetal movements often increase: °· After eating a full meal. °· After physical activity. °· After   eating or drinking something sweet or cold. °· At rest. °Pay attention to when you feel the baby is most active. This will help you notice a pattern of your baby's sleep and wake cycles and what factors contribute to an increase in fetal movement. It is important to perform a fetal  movement count at the same time each day when your baby is normally most active.  °HOW TO COUNT FETAL MOVEMENTS °· Find a quiet and comfortable area to sit or lie down on your left side. Lying on your left side provides the best blood and oxygen circulation to your baby. °· Write down the day and time on a sheet of paper or in a journal. °· Start counting kicks, flutters, swishes, rolls, or jabs in a 2-hour period. You should feel at least 10 movements within 2 hours. °· If you do not feel 10 movements in 2 hours, wait 2-3 hours and count again. Look for a change in the pattern or not enough counts in 2 hours. °SEEK MEDICAL CARE IF: °· You feel less than 10 counts in 2 hours, tried twice. °· There is no movement in over an hour. °· The pattern is changing or taking longer each day to reach 10 counts in 2 hours. °· You feel the baby is not moving as he or she usually does. °Date: ____________ Movements: ____________ Start time: ____________ Finish time: ____________  °Date: ____________ Movements: ____________ Start time: ____________ Finish time: ____________ °Date: ____________ Movements: ____________ Start time: ____________ Finish time: ____________ °Date: ____________ Movements: ____________ Start time: ____________ Finish time: ____________ °Date: ____________ Movements: ____________ Start time: ____________ Finish time: ____________ °Date: ____________ Movements: ____________ Start time: ____________ Finish time: ____________ °Date: ____________ Movements: ____________ Start time: ____________ Finish time: ____________ °Date: ____________ Movements: ____________ Start time: ____________ Finish time: ____________  °Date: ____________ Movements: ____________ Start time: ____________ Finish time: ____________ °Date: ____________ Movements: ____________ Start time: ____________ Finish time: ____________ °Date: ____________ Movements: ____________ Start time: ____________ Finish time: ____________ °Date:  ____________ Movements: ____________ Start time: ____________ Finish time: ____________ °Date: ____________ Movements: ____________ Start time: ____________ Finish time: ____________ °Date: ____________ Movements: ____________ Start time: ____________ Finish time: ____________ °Date: ____________ Movements: ____________ Start time: ____________ Finish time: ____________  °Date: ____________ Movements: ____________ Start time: ____________ Finish time: ____________ °Date: ____________ Movements: ____________ Start time: ____________ Finish time: ____________ °Date: ____________ Movements: ____________ Start time: ____________ Finish time: ____________ °Date: ____________ Movements: ____________ Start time: ____________ Finish time: ____________ °Date: ____________ Movements: ____________ Start time: ____________ Finish time: ____________ °Date: ____________ Movements: ____________ Start time: ____________ Finish time: ____________ °Date: ____________ Movements: ____________ Start time: ____________ Finish time: ____________  °Date: ____________ Movements: ____________ Start time: ____________ Finish time: ____________ °Date: ____________ Movements: ____________ Start time: ____________ Finish time: ____________ °Date: ____________ Movements: ____________ Start time: ____________ Finish time: ____________ °Date: ____________ Movements: ____________ Start time: ____________ Finish time: ____________ °Date: ____________ Movements: ____________ Start time: ____________ Finish time: ____________ °Date: ____________ Movements: ____________ Start time: ____________ Finish time: ____________ °Date: ____________ Movements: ____________ Start time: ____________ Finish time: ____________  °Date: ____________ Movements: ____________ Start time: ____________ Finish time: ____________ °Date: ____________ Movements: ____________ Start time: ____________ Finish time: ____________ °Date: ____________ Movements: ____________ Start  time: ____________ Finish time: ____________ °Date: ____________ Movements: ____________ Start time: ____________ Finish time: ____________ °Date: ____________ Movements: ____________ Start time: ____________ Finish time: ____________ °Date: ____________ Movements: ____________ Start time: ____________ Finish time: ____________ °Date: ____________ Movements: ____________ Start time: ____________ Finish time: ____________  °Date:   ____________ Movements: ____________ Start time: ____________ Finish time: ____________ °Date: ____________ Movements: ____________ Start time: ____________ Finish time: ____________ °Date: ____________ Movements: ____________ Start time: ____________ Finish time: ____________ °Date: ____________ Movements: ____________ Start time: ____________ Finish time: ____________ °Date: ____________ Movements: ____________ Start time: ____________ Finish time: ____________ °Date: ____________ Movements: ____________ Start time: ____________ Finish time: ____________ °Date: ____________ Movements: ____________ Start time: ____________ Finish time: ____________  °Date: ____________ Movements: ____________ Start time: ____________ Finish time: ____________ °Date: ____________ Movements: ____________ Start time: ____________ Finish time: ____________ °Date: ____________ Movements: ____________ Start time: ____________ Finish time: ____________ °Date: ____________ Movements: ____________ Start time: ____________ Finish time: ____________ °Date: ____________ Movements: ____________ Start time: ____________ Finish time: ____________ °Date: ____________ Movements: ____________ Start time: ____________ Finish time: ____________ °Date: ____________ Movements: ____________ Start time: ____________ Finish time: ____________  °Date: ____________ Movements: ____________ Start time: ____________ Finish time: ____________ °Date: ____________ Movements: ____________ Start time: ____________ Finish time:  ____________ °Date: ____________ Movements: ____________ Start time: ____________ Finish time: ____________ °Date: ____________ Movements: ____________ Start time: ____________ Finish time: ____________ °Date: ____________ Movements: ____________ Start time: ____________ Finish time: ____________ °Date: ____________ Movements: ____________ Start time: ____________ Finish time: ____________ °Document Released: 01/27/2006 Document Revised: 05/14/2013 Document Reviewed: 10/25/2011 °[employer]® Patient Information ©2015 [employer]. This information is not intended to replace advice given to you by your health care provider. Make sure you discuss any questions you have with your health care provider. ° °

## 2013-12-12 NOTE — Progress Notes (Signed)
33 weeks, stable.  Notes decreased fetal movement x 1 week.  Denies vaginal bleeding, LOF, dysuria. Will obtain NST today PNV qd. RTC 2 weeks

## 2013-12-12 NOTE — Progress Notes (Signed)
Pt felt good FM during NST. 

## 2013-12-26 ENCOUNTER — Ambulatory Visit (INDEPENDENT_AMBULATORY_CARE_PROVIDER_SITE_OTHER): Payer: Medicaid Other | Admitting: Physician Assistant

## 2013-12-26 ENCOUNTER — Other Ambulatory Visit: Payer: Self-pay | Admitting: Physician Assistant

## 2013-12-26 VITALS — BP 116/73 | HR 82 | Wt 135.1 lb

## 2013-12-26 DIAGNOSIS — Z3483 Encounter for supervision of other normal pregnancy, third trimester: Secondary | ICD-10-CM

## 2013-12-26 DIAGNOSIS — Z3493 Encounter for supervision of normal pregnancy, unspecified, third trimester: Secondary | ICD-10-CM

## 2013-12-26 LAB — POCT URINALYSIS DIP (DEVICE)
Bilirubin Urine: NEGATIVE
Glucose, UA: NEGATIVE mg/dL
HGB URINE DIPSTICK: NEGATIVE
Ketones, ur: NEGATIVE mg/dL
NITRITE: NEGATIVE
PH: 7 (ref 5.0–8.0)
Protein, ur: NEGATIVE mg/dL
Specific Gravity, Urine: 1.01 (ref 1.005–1.030)
Urobilinogen, UA: 0.2 mg/dL (ref 0.0–1.0)

## 2013-12-26 LAB — OB RESULTS CONSOLE GBS: GBS: NEGATIVE

## 2013-12-26 LAB — OB RESULTS CONSOLE GC/CHLAMYDIA
Chlamydia: NEGATIVE
Gonorrhea: NEGATIVE

## 2013-12-26 MED ORDER — DOCUSATE SODIUM 100 MG PO CAPS: 100.0000 mg | ORAL_CAPSULE | Freq: Two times a day (BID) | ORAL | Status: DC

## 2013-12-26 NOTE — Progress Notes (Signed)
23yo female presenting today at 35weeks 6 days. Experiences 2-3 Braxton-Hicks contractions/day. Denies leakage of fluid, vaginal discharge, and vaginal bleeding. Notes ~4 fetal movements today, but states this is unchanged since last visit; NST at last visit was normal. Complains of constipation since last week and states last bowel movement was two days ago. Will send in prescription for Docusate Sodium today. No further complaints today.

## 2013-12-26 NOTE — Patient Instructions (Signed)
Third Trimester of Pregnancy The third trimester is from week 29 through week 42, months 7 through 9. The third trimester is a time when the fetus is growing rapidly. At the end of the ninth month, the fetus is about 20 inches in length and weighs 6-10 pounds.  BODY CHANGES Your body goes through many changes during pregnancy. The changes vary from woman to woman.   Your weight will continue to increase. You can expect to gain 25-35 pounds (11-16 kg) by the end of the pregnancy.  You may begin to get stretch marks on your hips, abdomen, and breasts.  You may urinate more often because the fetus is moving lower into your pelvis and pressing on your bladder.  You may develop or continue to have heartburn as a result of your pregnancy.  You may develop constipation because certain hormones are causing the muscles that push waste through your intestines to slow down.  You may develop hemorrhoids or swollen, bulging veins (varicose veins).  You may have pelvic pain because of the weight gain and pregnancy hormones relaxing your joints between the bones in your pelvis. Backaches may result from overexertion of the muscles supporting your posture.  You may have changes in your hair. These can include thickening of your hair, rapid growth, and changes in texture. Some women also have hair loss during or after pregnancy, or hair that feels dry or thin. Your hair will most likely return to normal after your baby is born.  Your breasts will continue to grow and be tender. A yellow discharge may leak from your breasts called colostrum.  Your belly button may stick out.  You may feel short of breath because of your expanding uterus.  You may notice the fetus "dropping," or moving lower in your abdomen.  You may have a bloody mucus discharge. This usually occurs a few days to a week before labor begins.  Your cervix becomes thin and soft (effaced) near your due date. WHAT TO EXPECT AT YOUR PRENATAL  EXAMS  You will have prenatal exams every 2 weeks until week 36. Then, you will have weekly prenatal exams. During a routine prenatal visit:  You will be weighed to make sure you and the fetus are growing normally.  Your blood pressure is taken.  Your abdomen will be measured to track your baby's growth.  The fetal heartbeat will be listened to.  Any test results from the previous visit will be discussed.  You may have a cervical check near your due date to see if you have effaced. At around 36 weeks, your caregiver will check your cervix. At the same time, your caregiver will also perform a test on the secretions of the vaginal tissue. This test is to determine if a type of bacteria, Group B streptococcus, is present. Your caregiver will explain this further. Your caregiver may ask you:  What your birth plan is.  How you are feeling.  If you are feeling the baby move.  If you have had any abnormal symptoms, such as leaking fluid, bleeding, severe headaches, or abdominal cramping.  If you have any questions. Other tests or screenings that may be performed during your third trimester include:  Blood tests that check for low iron levels (anemia).  Fetal testing to check the health, activity level, and growth of the fetus. Testing is done if you have certain medical conditions or if there are problems during the pregnancy. FALSE LABOR You may feel small, irregular contractions that   eventually go away. These are called Braxton Hicks contractions, or false labor. Contractions may last for hours, days, or even weeks before true labor sets in. If contractions come at regular intervals, intensify, or become painful, it is best to be seen by your caregiver.  SIGNS OF LABOR   Menstrual-like cramps.  Contractions that are 5 minutes apart or less.  Contractions that start on the top of the uterus and spread down to the lower abdomen and back.  A sense of increased pelvic pressure or back  pain.  A watery or bloody mucus discharge that comes from the vagina. If you have any of these signs before the 37th week of pregnancy, call your caregiver right away. You need to go to the hospital to get checked immediately. HOME CARE INSTRUCTIONS   Avoid all smoking, herbs, alcohol, and unprescribed drugs. These chemicals affect the formation and growth of the baby.  Follow your caregiver's instructions regarding medicine use. There are medicines that are either safe or unsafe to take during pregnancy.  Exercise only as directed by your caregiver. Experiencing uterine cramps is a good sign to stop exercising.  Continue to eat regular, healthy meals.  Wear a good support bra for breast tenderness.  Do not use hot tubs, steam rooms, or saunas.  Wear your seat belt at all times when driving.  Avoid raw meat, uncooked cheese, cat litter boxes, and soil used by cats. These carry germs that can cause birth defects in the baby.  Take your prenatal vitamins.  Try taking a stool softener (if your caregiver approves) if you develop constipation. Eat more high-fiber foods, such as fresh vegetables or fruit and whole grains. Drink plenty of fluids to keep your urine clear or pale yellow.  Take warm sitz baths to soothe any pain or discomfort caused by hemorrhoids. Use hemorrhoid cream if your caregiver approves.  If you develop varicose veins, wear support hose. Elevate your feet for 15 minutes, 3-4 times a day. Limit salt in your diet.  Avoid heavy lifting, wear low heal shoes, and practice good posture.  Rest a lot with your legs elevated if you have leg cramps or low back pain.  Visit your dentist if you have not gone during your pregnancy. Use a soft toothbrush to brush your teeth and be gentle when you floss.  A sexual relationship may be continued unless your caregiver directs you otherwise.  Do not travel far distances unless it is absolutely necessary and only with the approval  of your caregiver.  Take prenatal classes to understand, practice, and ask questions about the labor and delivery.  Make a trial run to the hospital.  Pack your hospital bag.  Prepare the baby's nursery.  Continue to go to all your prenatal visits as directed by your caregiver. SEEK MEDICAL CARE IF:  You are unsure if you are in labor or if your water has broken.  You have dizziness.  You have mild pelvic cramps, pelvic pressure, or nagging pain in your abdominal area.  You have persistent nausea, vomiting, or diarrhea.  You have a bad smelling vaginal discharge.  You have pain with urination. SEEK IMMEDIATE MEDICAL CARE IF:   You have a fever.  You are leaking fluid from your vagina.  You have spotting or bleeding from your vagina.  You have severe abdominal cramping or pain.  You have rapid weight loss or gain.  You have shortness of breath with chest pain.  You notice sudden or extreme swelling   of your face, hands, ankles, feet, or legs.  You have not felt your baby move in over an hour.  You have severe headaches that do not go away with medicine.  You have vision changes. Document Released: 12/22/2000 Document Revised: 01/02/2013 Document Reviewed: 02/29/2012 [employer] Patient Information 2015 [employer]. This information is not intended to replace advice given to you by your health care provider. Make sure you discuss any questions you have with your health care provider.  

## 2013-12-26 NOTE — Progress Notes (Signed)
35 weeks.  Pt seen with Dr. Caroleen Hamman.  Agree with assessment and plan.  Pt to RTC in 1 week/continue daily PNV/increase po fluids and dietary fiber.  GBS/GC/Chlam pending.

## 2013-12-27 LAB — GC/CHLAMYDIA PROBE AMP
CT Probe RNA: NEGATIVE
GC Probe RNA: NEGATIVE

## 2013-12-28 LAB — CULTURE, STREPTOCOCCUS GRP B W/SUSCEPT

## 2014-01-02 ENCOUNTER — Ambulatory Visit (INDEPENDENT_AMBULATORY_CARE_PROVIDER_SITE_OTHER): Payer: Medicaid Other | Admitting: Physician Assistant

## 2014-01-02 VITALS — BP 116/76 | HR 84 | Temp 98.1°F | Wt 137.0 lb

## 2014-01-02 DIAGNOSIS — Z3483 Encounter for supervision of other normal pregnancy, third trimester: Secondary | ICD-10-CM

## 2014-01-02 LAB — POCT URINALYSIS DIP (DEVICE)
Bilirubin Urine: NEGATIVE
Glucose, UA: NEGATIVE mg/dL
Hgb urine dipstick: NEGATIVE
KETONES UR: NEGATIVE mg/dL
Nitrite: NEGATIVE
PROTEIN: NEGATIVE mg/dL
SPECIFIC GRAVITY, URINE: 1.015 (ref 1.005–1.030)
Urobilinogen, UA: 0.2 mg/dL (ref 0.0–1.0)
pH: 7 (ref 5.0–8.0)

## 2014-01-02 NOTE — Progress Notes (Signed)
36 weeks, stable.  Few, mod contractions.  Denies LOF, vag bleeding, dysuria.  Endorses good fetal movement.   Labor precautions discussed Cont PNV qd RTC 1 week

## 2014-01-02 NOTE — Patient Instructions (Signed)
Breastfeeding Deciding to breastfeed is one of the best choices you can make for you and your baby. A change in hormones during pregnancy causes your breast tissue to grow and increases the number and size of your milk ducts. These hormones also allow proteins, sugars, and fats from your blood supply to make breast milk in your milk-producing glands. Hormones prevent breast milk from being released before your baby is born as well as prompt milk flow after birth. Once breastfeeding has begun, thoughts of your baby, as well as his or her sucking or crying, can stimulate the release of milk from your milk-producing glands.  BENEFITS OF BREASTFEEDING For Your Baby  Your first milk (colostrum) helps your baby's digestive system function better.   There are antibodies in your milk that help your baby fight off infections.   Your baby has a lower incidence of asthma, allergies, and sudden infant death syndrome.   The nutrients in breast milk are better for your baby than infant formulas and are designed uniquely for your baby's needs.   Breast milk improves your baby's brain development.   Your baby is less likely to develop other conditions, such as childhood obesity, asthma, or type 2 diabetes mellitus.  For You   Breastfeeding helps to create a very special bond between you and your baby.   Breastfeeding is convenient. Breast milk is always available at the correct temperature and costs nothing.   Breastfeeding helps to burn calories and helps you lose the weight gained during pregnancy.   Breastfeeding makes your uterus contract to its prepregnancy size faster and slows bleeding (lochia) after you give birth.   Breastfeeding helps to lower your risk of developing type 2 diabetes mellitus, osteoporosis, and breast or ovarian cancer later in life. SIGNS THAT YOUR BABY IS HUNGRY Early Signs of Hunger  Increased alertness or activity.  Stretching.  Movement of the head from  side to side.  Movement of the head and opening of the mouth when the corner of the mouth or cheek is stroked (rooting).  Increased sucking sounds, smacking lips, cooing, sighing, or squeaking.  Hand-to-mouth movements.  Increased sucking of fingers or hands. Late Signs of Hunger  Fussing.  Intermittent crying. Extreme Signs of Hunger Signs of extreme hunger will require calming and consoling before your baby will be able to breastfeed successfully. Do not wait for the following signs of extreme hunger to occur before you initiate breastfeeding:   Restlessness.  A loud, strong cry.   Screaming. BREASTFEEDING BASICS Breastfeeding Initiation  Find a comfortable place to sit or lie down, with your neck and back well supported.  Place a pillow or rolled up blanket under your baby to bring him or her to the level of your breast (if you are seated). Nursing pillows are specially designed to help support your arms and your baby while you breastfeed.  Make sure that your baby's abdomen is facing your abdomen.   Gently massage your breast. With your fingertips, massage from your chest wall toward your nipple in a circular motion. This encourages milk flow. You may need to continue this action during the feeding if your milk flows slowly.  Support your breast with 4 fingers underneath and your thumb above your nipple. Make sure your fingers are well away from your nipple and your baby's mouth.   Stroke your baby's lips gently with your finger or nipple.   When your baby's mouth is open wide enough, quickly bring your baby to your   breast, placing your entire nipple and as much of the colored area around your nipple (areola) as possible into your baby's mouth.   More areola should be visible above your baby's upper lip than below the lower lip.   Your baby's tongue should be between his or her lower gum and your breast.   Ensure that your baby's mouth is correctly positioned  around your nipple (latched). Your baby's lips should create a seal on your breast and be turned out (everted).  It is common for your baby to suck about 2-3 minutes in order to start the flow of breast milk. Latching Teaching your baby how to latch on to your breast properly is very important. An improper latch can cause nipple pain and decreased milk supply for you and poor weight gain in your baby. Also, if your baby is not latched onto your nipple properly, he or she may swallow some air during feeding. This can make your baby fussy. Burping your baby when you switch breasts during the feeding can help to get rid of the air. However, teaching your baby to latch on properly is still the best way to prevent fussiness from swallowing air while breastfeeding. Signs that your baby has successfully latched on to your nipple:    Silent tugging or silent sucking, without causing you pain.   Swallowing heard between every 3-4 sucks.    Muscle movement above and in front of his or her ears while sucking.  Signs that your baby has not successfully latched on to nipple:   Sucking sounds or smacking sounds from your baby while breastfeeding.  Nipple pain. If you think your baby has not latched on correctly, slip your finger into the corner of your baby's mouth to break the suction and place it between your baby's gums. Attempt breastfeeding initiation again. Signs of Successful Breastfeeding Signs from your baby:   A gradual decrease in the number of sucks or complete cessation of sucking.   Falling asleep.   Relaxation of his or her body.   Retention of a small amount of milk in his or her mouth.   Letting go of your breast by himself or herself. Signs from you:  Breasts that have increased in firmness, weight, and size 1-3 hours after feeding.   Breasts that are softer immediately after breastfeeding.  Increased milk volume, as well as a change in milk consistency and color by  the fifth day of breastfeeding.   Nipples that are not sore, cracked, or bleeding. Signs That Your Baby is Getting Enough Milk  Wetting at least 3 diapers in a 24-hour period. The urine should be clear and pale yellow by age 5 days.  At least 3 stools in a 24-hour period by age 5 days. The stool should be soft and yellow.  At least 3 stools in a 24-hour period by age 7 days. The stool should be seedy and yellow.  No loss of weight greater than 10% of birth weight during the first 3 days of age.  Average weight gain of 4-7 ounces (113-198 g) per week after age 4 days.  Consistent daily weight gain by age 5 days, without weight loss after the age of 2 weeks. After a feeding, your baby may spit up a small amount. This is common. BREASTFEEDING FREQUENCY AND DURATION Frequent feeding will help you make more milk and can prevent sore nipples and breast engorgement. Breastfeed when you feel the need to reduce the fullness of your breasts   or when your baby shows signs of hunger. This is called "breastfeeding on demand." Avoid introducing a pacifier to your baby while you are working to establish breastfeeding (the first 4-6 weeks after your baby is born). After this time you may choose to use a pacifier. Research has shown that pacifier use during the first year of a baby's life decreases the risk of sudden infant death syndrome (SIDS). Allow your baby to feed on each breast as long as he or she wants. Breastfeed until your baby is finished feeding. When your baby unlatches or falls asleep while feeding from the first breast, offer the second breast. Because newborns are often sleepy in the first few weeks of life, you may need to awaken your baby to get him or her to feed. Breastfeeding times will vary from baby to baby. However, the following rules can serve as a guide to help you ensure that your baby is properly fed:  Newborns (babies 4 weeks of age or younger) may breastfeed every 1-3  hours.  Newborns should not go longer than 3 hours during the day or 5 hours during the night without breastfeeding.  You should breastfeed your baby a minimum of 8 times in a 24-hour period until you begin to introduce solid foods to your baby at around 6 months of age. BREAST MILK PUMPING Pumping and storing breast milk allows you to ensure that your baby is exclusively fed your breast milk, even at times when you are unable to breastfeed. This is especially important if you are going back to work while you are still breastfeeding or when you are not able to be present during feedings. Your lactation consultant can give you guidelines on how long it is safe to store breast milk.  A breast pump is a machine that allows you to pump milk from your breast into a sterile bottle. The pumped breast milk can then be stored in a refrigerator or freezer. Some breast pumps are operated by hand, while others use electricity. Ask your lactation consultant which type will work best for you. Breast pumps can be purchased, but some hospitals and breastfeeding support groups lease breast pumps on a monthly basis. A lactation consultant can teach you how to hand express breast milk, if you prefer not to use a pump.  CARING FOR YOUR BREASTS WHILE YOU BREASTFEED Nipples can become dry, cracked, and sore while breastfeeding. The following recommendations can help keep your breasts moisturized and healthy:  Avoid using soap on your nipples.   Wear a supportive bra. Although not required, special nursing bras and tank tops are designed to allow access to your breasts for breastfeeding without taking off your entire bra or top. Avoid wearing underwire-style bras or extremely tight bras.  Air dry your nipples for 3-4minutes after each feeding.   Use only cotton bra pads to absorb leaked breast milk. Leaking of breast milk between feedings is normal.   Use lanolin on your nipples after breastfeeding. Lanolin helps to  maintain your skin's normal moisture barrier. If you use pure lanolin, you do not need to wash it off before feeding your baby again. Pure lanolin is not toxic to your baby. You may also hand express a few drops of breast milk and gently massage that milk into your nipples and allow the milk to air dry. In the first few weeks after giving birth, some women experience extremely full breasts (engorgement). Engorgement can make your breasts feel heavy, warm, and tender to the   touch. Engorgement peaks within 3-5 days after you give birth. The following recommendations can help ease engorgement:  Completely empty your breasts while breastfeeding or pumping. You may want to start by applying warm, moist heat (in the shower or with warm water-soaked hand towels) just before feeding or pumping. This increases circulation and helps the milk flow. If your baby does not completely empty your breasts while breastfeeding, pump any extra milk after he or she is finished.  Wear a snug bra (nursing or regular) or tank top for 1-2 days to signal your body to slightly decrease milk production.  Apply ice packs to your breasts, unless this is too uncomfortable for you.  Make sure that your baby is latched on and positioned properly while breastfeeding. If engorgement persists after 48 hours of following these recommendations, contact your health care provider or a lactation consultant. OVERALL HEALTH CARE RECOMMENDATIONS WHILE BREASTFEEDING  Eat healthy foods. Alternate between meals and snacks, eating 3 of each per day. Because what you eat affects your breast milk, some of the foods may make your baby more irritable than usual. Avoid eating these foods if you are sure that they are negatively affecting your baby.  Drink milk, fruit juice, and water to satisfy your thirst (about 10 glasses a day).   Rest often, relax, and continue to take your prenatal vitamins to prevent fatigue, stress, and anemia.  Continue  breast self-awareness checks.  Avoid chewing and smoking tobacco.  Avoid alcohol and drug use. Some medicines that may be harmful to your baby can pass through breast milk. It is important to ask your health care provider before taking any medicine, including all over-the-counter and prescription medicine as well as vitamin and herbal supplements. It is possible to become pregnant while breastfeeding. If birth control is desired, ask your health care provider about options that will be safe for your baby. SEEK MEDICAL CARE IF:   You feel like you want to stop breastfeeding or have become frustrated with breastfeeding.  You have painful breasts or nipples.  Your nipples are cracked or bleeding.  Your breasts are red, tender, or warm.  You have a swollen area on either breast.  You have a fever or chills.  You have nausea or vomiting.  You have drainage other than breast milk from your nipples.  Your breasts do not become full before feedings by the fifth day after you give birth.  You feel sad and depressed.  Your baby is too sleepy to eat well.  Your baby is having trouble sleeping.   Your baby is wetting less than 3 diapers in a 24-hour period.  Your baby has less than 3 stools in a 24-hour period.  Your baby's skin or the white part of his or her eyes becomes yellow.   Your baby is not gaining weight by 5 days of age. SEEK IMMEDIATE MEDICAL CARE IF:   Your baby is overly tired (lethargic) and does not want to wake up and feed.  Your baby develops an unexplained fever. Document Released: 12/28/2004 Document Revised: 01/02/2013 Document Reviewed: 06/21/2012 [employer] Patient Information 2015 [employer]. This information is not intended to replace advice given to you by your health care provider. Make sure you discuss any questions you have with your health care provider.  

## 2014-01-02 NOTE — Progress Notes (Signed)
No complaints today.

## 2014-01-07 ENCOUNTER — Inpatient Hospital Stay (HOSPITAL_COMMUNITY)
Admission: AD | Admit: 2014-01-07 | Discharge: 2014-01-09 | DRG: 775 | Disposition: A | Discharge status: Home / Self Care | Payer: Medicaid Other | Source: Ambulatory Visit | Attending: Family Medicine | Admitting: Family Medicine

## 2014-01-07 ENCOUNTER — Inpatient Hospital Stay (HOSPITAL_COMMUNITY): Payer: Medicaid Other | Admitting: Anesthesiology

## 2014-01-07 ENCOUNTER — Encounter (HOSPITAL_COMMUNITY): Payer: Self-pay | Admitting: Obstetrics and Gynecology

## 2014-01-07 DIAGNOSIS — Z3A37 37 weeks gestation of pregnancy: Secondary | ICD-10-CM | POA: Diagnosis present

## 2014-01-07 DIAGNOSIS — IMO0001 Reserved for inherently not codable concepts without codable children: Secondary | ICD-10-CM

## 2014-01-07 DIAGNOSIS — Z3483 Encounter for supervision of other normal pregnancy, third trimester: Secondary | ICD-10-CM | POA: Diagnosis present

## 2014-01-07 LAB — CBC
HCT: 40 % (ref 36.0–46.0)
Hemoglobin: 13.4 g/dL (ref 12.0–15.0)
MCH: 30.6 pg (ref 26.0–34.0)
MCHC: 33.5 g/dL (ref 30.0–36.0)
MCV: 91.3 fL (ref 78.0–100.0)
PLATELETS: 211 10*3/uL (ref 150–400)
RBC: 4.38 MIL/uL (ref 3.87–5.11)
RDW: 13.6 % (ref 11.5–15.5)
WBC: 10.6 10*3/uL — AB (ref 4.0–10.5)

## 2014-01-07 LAB — TYPE AND SCREEN
ABO/RH(D): O POS
ANTIBODY SCREEN: NEGATIVE

## 2014-01-07 LAB — RPR

## 2014-01-07 LAB — HIV ANTIBODY (ROUTINE TESTING W REFLEX): HIV: NONREACTIVE

## 2014-01-07 LAB — ABO/RH: ABO/RH(D): O POS

## 2014-01-07 MED ORDER — PHENYLEPHRINE 40 MCG/ML (10ML) SYRINGE FOR IV PUSH (FOR BLOOD PRESSURE SUPPORT)
80.0000 ug | PREFILLED_SYRINGE | INTRAVENOUS | Status: DC | PRN
  Filled 2014-01-07: qty 2

## 2014-01-07 MED ORDER — BENZOCAINE-MENTHOL 20-0.5 % EX AERO
1.0000 "application " | INHALATION_SPRAY | CUTANEOUS | Status: DC | PRN
  Administered 2014-01-08: 1 via TOPICAL
  Filled 2014-01-07: qty 56

## 2014-01-07 MED ORDER — PHENYLEPHRINE 40 MCG/ML (10ML) SYRINGE FOR IV PUSH (FOR BLOOD PRESSURE SUPPORT)
PREFILLED_SYRINGE | INTRAVENOUS | Status: AC
  Filled 2014-01-07: qty 10

## 2014-01-07 MED ORDER — CITRIC ACID-SODIUM CITRATE 334-500 MG/5ML PO SOLN: 30.0000 mL | ORAL | Status: DC | PRN

## 2014-01-07 MED ORDER — ACETAMINOPHEN 325 MG PO TABS: 650.0000 mg | ORAL_TABLET | ORAL | Status: DC | PRN

## 2014-01-07 MED ORDER — SENNOSIDES-DOCUSATE SODIUM 8.6-50 MG PO TABS
2.0000 | ORAL_TABLET | ORAL | Status: DC
  Administered 2014-01-07: 2 via ORAL
  Filled 2014-01-07 (×2): qty 2

## 2014-01-07 MED ORDER — FENTANYL 2.5 MCG/ML BUPIVACAINE 1/10 % EPIDURAL INFUSION (WH - ANES)
14.0000 mL/h | INTRAMUSCULAR | Status: DC | PRN
  Administered 2014-01-07: 14 mL/h via EPIDURAL

## 2014-01-07 MED ORDER — OXYCODONE-ACETAMINOPHEN 5-325 MG PO TABS: 2.0000 | ORAL_TABLET | ORAL | Status: DC | PRN

## 2014-01-07 MED ORDER — ONDANSETRON HCL 4 MG/2ML IJ SOLN: 4.0000 mg | Freq: Four times a day (QID) | INTRAMUSCULAR | Status: DC | PRN

## 2014-01-07 MED ORDER — LACTATED RINGERS IV SOLN: 500.0000 mL | INTRAVENOUS | Status: DC | PRN

## 2014-01-07 MED ORDER — WITCH HAZEL-GLYCERIN EX PADS: 1.0000 "application " | MEDICATED_PAD | CUTANEOUS | Status: DC | PRN

## 2014-01-07 MED ORDER — FENTANYL 2.5 MCG/ML BUPIVACAINE 1/10 % EPIDURAL INFUSION (WH - ANES): 14.0000 mL/h | INTRAMUSCULAR | Status: DC | PRN

## 2014-01-07 MED ORDER — FENTANYL 2.5 MCG/ML BUPIVACAINE 1/10 % EPIDURAL INFUSION (WH - ANES)
INTRAMUSCULAR | Status: AC
  Filled 2014-01-07: qty 125

## 2014-01-07 MED ORDER — ONDANSETRON HCL 4 MG PO TABS
4.0000 mg | ORAL_TABLET | ORAL | Status: DC | PRN
Start: 2014-01-07 — End: 2014-01-09

## 2014-01-07 MED ORDER — TETANUS-DIPHTH-ACELL PERTUSSIS 5-2.5-18.5 LF-MCG/0.5 IM SUSP
0.5000 mL | Freq: Once | INTRAMUSCULAR | Status: DC
Start: 2014-01-08 — End: 2014-01-09

## 2014-01-07 MED ORDER — LANOLIN HYDROUS EX OINT: TOPICAL_OINTMENT | CUTANEOUS | Status: DC | PRN

## 2014-01-07 MED ORDER — OXYCODONE-ACETAMINOPHEN 5-325 MG PO TABS: 1.0000 | ORAL_TABLET | ORAL | Status: DC | PRN

## 2014-01-07 MED ORDER — ZOLPIDEM TARTRATE 5 MG PO TABS
5.0000 mg | ORAL_TABLET | Freq: Every evening | ORAL | Status: DC | PRN
Start: 2014-01-07 — End: 2014-01-09

## 2014-01-07 MED ORDER — EPHEDRINE 5 MG/ML INJ
10.0000 mg | INTRAVENOUS | Status: DC | PRN
  Filled 2014-01-07: qty 2

## 2014-01-07 MED ORDER — DIPHENHYDRAMINE HCL 50 MG/ML IJ SOLN: 12.5000 mg | INTRAMUSCULAR | Status: DC | PRN

## 2014-01-07 MED ORDER — IBUPROFEN 600 MG PO TABS
600.0000 mg | ORAL_TABLET | Freq: Four times a day (QID) | ORAL | Status: DC
  Administered 2014-01-07 – 2014-01-09 (×5): 600 mg via ORAL
  Filled 2014-01-07 (×6): qty 1

## 2014-01-07 MED ORDER — DIBUCAINE 1 % RE OINT: 1.0000 "application " | TOPICAL_OINTMENT | RECTAL | Status: DC | PRN

## 2014-01-07 MED ORDER — LACTATED RINGERS IV SOLN
500.0000 mL | Freq: Once | INTRAVENOUS | Status: AC
  Administered 2014-01-07: 500 mL via INTRAVENOUS

## 2014-01-07 MED ORDER — LACTATED RINGERS IV SOLN
INTRAVENOUS | Status: DC
  Administered 2014-01-07: 13:00:00 via INTRAVENOUS

## 2014-01-07 MED ORDER — LIDOCAINE HCL (PF) 1 % IJ SOLN
30.0000 mL | INTRAMUSCULAR | Status: DC | PRN
  Administered 2014-01-07: 30 mL via SUBCUTANEOUS
  Filled 2014-01-07: qty 30

## 2014-01-07 MED ORDER — SIMETHICONE 80 MG PO CHEW: 80.0000 mg | CHEWABLE_TABLET | ORAL | Status: DC | PRN

## 2014-01-07 MED ORDER — DIPHENHYDRAMINE HCL 25 MG PO CAPS: 25.0000 mg | ORAL_CAPSULE | Freq: Four times a day (QID) | ORAL | Status: DC | PRN

## 2014-01-07 MED ORDER — OXYTOCIN BOLUS FROM INFUSION
500.0000 mL | INTRAVENOUS | Status: DC
  Administered 2014-01-07: 500 mL via INTRAVENOUS

## 2014-01-07 MED ORDER — OXYTOCIN 40 UNITS IN LACTATED RINGERS INFUSION - SIMPLE MED
62.5000 mL/h | INTRAVENOUS | Status: DC
  Filled 2014-01-07: qty 1000

## 2014-01-07 MED ORDER — LIDOCAINE HCL (PF) 1 % IJ SOLN
INTRAMUSCULAR | Status: DC | PRN
  Administered 2014-01-07: 3 mL
  Administered 2014-01-07: 5 mL

## 2014-01-07 MED ORDER — PRENATAL MULTIVITAMIN CH
1.0000 | ORAL_TABLET | Freq: Every day | ORAL | Status: DC
  Administered 2014-01-08: 1 via ORAL
  Filled 2014-01-07: qty 1

## 2014-01-07 MED ORDER — ONDANSETRON HCL 4 MG/2ML IJ SOLN: 4.0000 mg | INTRAMUSCULAR | Status: DC | PRN

## 2014-01-07 NOTE — MAU Note (Signed)
Here for contractions, no bleeding or lof.

## 2014-01-07 NOTE — H&P (Signed)
Victoria Fitzpatrick is a 23 y.o. female presenting for SOL  Maternal Medical History:  Reason for admission: Contractions.  Nausea.  Contractions: Onset was 3-5 hours ago.   Frequency: regular.   Duration is approximately 70 seconds.   Perceived severity is moderate.    Fetal activity: Perceived fetal activity is normal.   Last perceived fetal movement was within the past hour.    Prenatal complications: no prenatal complications Prenatal Complications - Diabetes: none.    OB History    Gravida Para Term Preterm AB TAB SAB Ectopic Multiple Living   2 1 1       1      Past Medical History  Diagnosis Date  . Medical history non-contributory    Past Surgical History  Procedure Laterality Date  . No past surgeries     Family History: family history is not on file. Social History:  reports that she has never smoked. She does not have any smokeless tobacco history on file. She reports that she does not drink alcohol or use illicit drugs.   Prenatal Transfer Tool  Maternal Diabetes: No Genetic Screening: Normal Maternal Ultrasounds/Referrals: Normal Fetal Ultrasounds or other Referrals:  None Maternal Substance Abuse:  No Significant Maternal Medications:  None Significant Maternal Lab Results:  Lab values include: Group B Strep negative Other Comments:  None  Review of Systems  Constitutional: Negative for fever.  Respiratory: Negative for cough.   Cardiovascular: Negative for chest pain.  Gastrointestinal: Negative for nausea and vomiting.  Genitourinary: Negative for dysuria.  Musculoskeletal: Negative for joint pain.  Neurological: Negative for dizziness and headaches.    Dilation: 6 Effacement (%): 70 Station: -1 Exam by:: SBeck, RN Blood pressure 126/77, pulse 80, temperature 98.1 F (36.7 C), temperature source Oral, resp. rate 16, height 5' (1.524 m), weight 63.05 kg (139 lb), last menstrual period 04/19/2013. Maternal Exam:  Uterine Assessment: Contraction  strength is moderate.  Contraction frequency is regular.   Abdomen: Estimated fetal weight is 6-6 1/2 #.   Fetal presentation: vertex  Introitus: Normal vulva. Vagina is negative for discharge.  Ferning test: not done.  Nitrazine test: not done. Amniotic fluid character: not assessed.  Pelvis: adequate for delivery.   Cervix: Cervix evaluated by digital exam.     Fetal Exam Fetal Monitor Review: Mode: ultrasound.   Baseline rate: 120.  Variability: moderate (6-25 bpm).   Pattern: accelerations present and no decelerations.    Fetal State Assessment: Category I - tracings are normal.     Physical Exam  Constitutional: She is oriented to person, place, and time. She appears well-developed and well-nourished.  HENT:  Head: Normocephalic.  Eyes: Pupils are equal, round, and reactive to light.  Neck: Normal range of motion. Neck supple. No thyromegaly present.  Cardiovascular: Normal rate, regular rhythm and normal heart sounds.   Respiratory: Breath sounds normal.  GI: Soft. There is no tenderness.  Genitourinary: Vagina normal. No vaginal discharge found.  Musculoskeletal: Normal range of motion.  Neurological: She is alert and oriented to person, place, and time. She has normal reflexes.  Skin: Skin is warm and dry.  Psychiatric: She has a normal mood and affect. Her behavior is normal.    Prenatal labs: ABO, Rh: O/POS/-- (08/05 1526) Antibody: NEG (08/05 1526) Rubella: 11.30 (08/05 1526) RPR: NON REAC (10/21 1210)  HBsAg: NEGATIVE (08/05 1526)  HIV: NONREACTIVE (10/21 1210)  GBS: Negative (12/16 0000)  GCT: 67  Assessment/Plan: G2P1001 at [redacted]w[redacted]d in active labor Expectant managaement  [name] 01/07/2014, 11:57 AM

## 2014-01-07 NOTE — Anesthesia Procedure Notes (Addendum)
Epidural Patient location during procedure: OB  Preanesthetic Checklist Completed: patient identified, site marked, surgical consent, pre-op evaluation, timeout performed, IV checked, risks and benefits discussed and monitors and equipment checked  Epidural Patient position: sitting Prep: site prepped and draped and DuraPrep Patient monitoring: continuous pulse ox and blood pressure Approach: midline Location: L3-L4 Injection technique: LOR air  Needle:  Needle type: Tuohy  Needle gauge: 17 G Needle length: 9 cm and 9 Needle insertion depth: 5 cm cm Catheter type: closed end flexible Catheter size: 19 Gauge Catheter at skin depth: 10 cm Test dose: negative  Assessment Events: blood not aspirated, injection not painful, no injection resistance, negative IV test and no paresthesia  Additional Notes Dosing of Epidural:  1st dose, through catheter ............................................Marland Kitchen Xylocaine 30 mg  2nd dose, through catheter, after waiting 3 minutes.......Marland KitchenXylocaine 50 mg   ( 1% Xylo charted as a single dose in Epic Meds for ease of charting; actual dosing was fractionated as above, for saftey's sake)  As each dose occurred, patient was free of IV sx; and patient exhibited no evidence of SA injection.  Patient is more comfortable after epidural dosed. Please see RN's note for documentation of vital signs,and FHR which are stable.  Patient reminded not to try to ambulate with numb legs, and that an RN must be present the 1st time she attempts to get up.

## 2014-01-07 NOTE — Progress Notes (Signed)
Patient ID: Victoria Fitzpatrick, female   DOB: 1990-07-29, 23 y.o.   MRN: [id number] Victoria Fitzpatrick is a 23 y.o. G2P1001 at [redacted]w[redacted]d admitted for SOL Subjective: Coping well but concerned she had rapid labor and requests epidural.  Objective: BP 106/54 mmHg  Pulse 82  Temp(Src) 97.4 F (36.3 C) (Oral)  Resp 20  Ht 5' (1.524 m)  Wt 63.05 kg (139 lb)  BMI 27.15 kg/m2  LMP 04/19/2013  Fetal Heart FHR: 140 bpm, variability: moderate,  accelerations:  Present,  decelerations:  Absent   Contractions: q4-7  SVE:   Dilation: 6 Effacement (%): 70 Station: -1 Exam by:: Ace Gins, RN My [id number]: 7/90/-2 bloody show  Assessment / Plan:  Labor: Active Fetal Wellbeing: Category 1 Pain Control:  Requests epidural> will proceed Expected mode of delivery: NSVD  Rushawn Capshaw 01/07/2014, 3:22 PM

## 2014-01-07 NOTE — Anesthesia Preprocedure Evaluation (Signed)

## 2014-01-07 NOTE — Progress Notes (Signed)
Notified of pt's exam, will come place admission orders.

## 2014-01-07 NOTE — MAU Note (Signed)
Contractions this morning. Denies LOF or vaginal bleeding. Decreased fetal movement, hasn't felt baby move since 3am.

## 2014-01-07 NOTE — MAU Note (Signed)
Contractions since yesterday, has gotten stronger and closer. No bleeding or leaking. No problems with preg. Has not been dilated

## 2014-01-08 NOTE — Lactation Note (Signed)
This note was copied from the chart of Victoria Arnitra Bayer. Lactation Consultation Note Follow up assessment requested from [facility] RN.  Baby had a critical high skin bili check and may need supplementation.  Triple photo therapy has just been started.  Mom is open to any feeding plan for her baby and is pumped with DEBP now collecting a few drops.  Finger fed with glove colostrum drops.  Baby refused pragestamil formula finger feeding and bottle feeding.  Diaper changed and attempted again, baby continues to refuse feedings.  Report to [facility] RN who reports baby will be transferred to NICU.  Encouraged mom to keep pumping to supply breast milk to baby.  MGM arrived to support mom.   MOm to call for assistance as needed.      Patient Name: Victoria Fitzpatrick [id number] Date: 01/08/2014 Reason for consult: Follow-up assessment   Maternal Data    Feeding Feeding Type: Breast Fed Length of feed: 30 min  LATCH Score/Interventions                      Lactation Tools Discussed/Used     Consult Status Consult Status: Follow-up Date: 01/09/14 Follow-up type: In-patient    Jannifer Rodney 01/08/2014, 8:28 PM

## 2014-01-08 NOTE — Anesthesia Postprocedure Evaluation (Signed)
  Anesthesia Post-op Note  Patient: Victoria Fitzpatrick  Procedure(s) Performed: * No procedures listed *  Patient Location: Mother/Baby  Anesthesia Type:Epidural  Level of Consciousness: awake, alert , oriented and patient cooperative  Airway and Oxygen Therapy: Patient Spontanous Breathing  Post-op Pain: mild  Post-op Assessment: Post-op Vital signs reviewed, Patient's Cardiovascular Status Stable, Respiratory Function Stable, Patent Airway, No headache, No backache, No residual numbness and No residual motor weakness  Post-op Vital Signs: Reviewed and stable  Last Vitals:  Filed Vitals:   01/08/14 0636  BP: 96/51  Pulse: 86  Temp: 37.1 C  Resp: 18    Complications: No apparent anesthesia complications

## 2014-01-08 NOTE — Lactation Note (Signed)
This note was copied from the chart of Victoria Petina Armistead. Lactation Consultation Note Initial visit at 23 hours of age.  Mom reports good feedings, 8 recorded with 3 voids and 2 stools. Baby last fed about 1 hours ago and is asleep in visitors arms.  Previous latch score of "10" although mom reports a  Little nipple pain.  Nipples are large normal and erect.  Mom previously breastfed and pumped for a baby less than 5 #.  Mom requests DEBP to help increase supply.  DEBP setup with instructions on use, cleaning and storage.  Encouraged mom to pump every 3 hours/8 times in 24 hours.  Encouraged mom to work on hand expression after pumping and offer EBM back to baby.  Foley cup provided with instructions.  [facility] LC resources given and discussed.  Encouraged to feed with early cues on demand.  Early newborn behavior discussed.  Hand expression demonstrated with colostrum visible.  Mom to call for assist as needed.    Patient Name: Victoria Fitzpatrick Today's Date: 01/08/2014 Reason for consult: Initial assessment   Maternal Data Has patient been taught Hand Expression?: Yes Does the patient have breastfeeding experience prior to this delivery?: Yes  Feeding Feeding Type: Breast Fed Length of feed: 30 min  LATCH Score/Interventions                Intervention(s): Breastfeeding basics reviewed     Lactation Tools Discussed/Used Pump Review: Setup, frequency, and cleaning Initiated by:: JS Date initiated:: 01/08/14   Consult Status Consult Status: Follow-up Date: 01/09/14 Follow-up type: In-patient    Shoptaw, Arvella Merles 01/08/2014, 4:25 PM

## 2014-01-08 NOTE — Progress Notes (Signed)
Post Partum Day 1  Subjective: no complaints  Objective: Blood pressure 96/51, pulse 86, temperature 98.7 F (37.1 C), temperature source Oral, resp. rate 18, height 5' (1.524 m), weight 63.05 kg (139 lb), last menstrual period 04/19/2013, SpO2 99 %, unknown if currently breastfeeding.  Physical Exam:  General: alert, cooperative and no distress Lochia: appropriate Uterine Fundus: firm DVT Evaluation: No evidence of DVT seen on physical exam.   Recent Labs  01/07/14 1230  HGB 13.4  HCT 40.0    Assessment/Plan: Plan for discharge tomorrow and Breastfeeding   LOS: 1 day   Bertram Denver 01/08/2014, 11:29 AM

## 2014-01-09 ENCOUNTER — Ambulatory Visit: Payer: Self-pay

## 2014-01-09 ENCOUNTER — Encounter: Payer: Medicaid Other | Admitting: Advanced Practice Midwife

## 2014-01-09 MED ORDER — IBUPROFEN 600 MG PO TABS: 600.0000 mg | ORAL_TABLET | Freq: Four times a day (QID) | ORAL | Status: DC

## 2014-01-09 NOTE — Discharge Instructions (Signed)

## 2014-01-09 NOTE — Lactation Note (Signed)
This note was copied from the chart of Victoria Fitzpatrick. Lactation Consultation Note  Mom states she is pumping a few mls of transitional milk every 3 hours.  WIC pump referral faxed to [facility] office.  Mom given breastfeeding hotline phone number if she doesn't here from them this AM.  Instructed to continue pumping every 3 hours to establish a good milk supply.  Encouraged to call with any concerns prn.  Patient Name: Victoria Johnny Caruthers KZLDJ'T Date: 01/09/2014     Maternal Data    Feeding Feeding Type: Formula Length of feed: 15 min  LATCH Score/Interventions                      Lactation Tools Discussed/Used     Consult Status      Advait Buice S 01/09/2014, 11:00 AM

## 2014-01-09 NOTE — Discharge Summary (Signed)
Obstetric Discharge Summary Reason for Admission: onset of labor Prenatal Procedures: ultrasound Intrapartum Procedures: spontaneous vaginal delivery Postpartum Procedures: none Complications-Operative and Postpartum: none HEMOGLOBIN  Date Value Ref Range Status  01/07/2014 13.4 12.0 - 15.0 g/dL Final   HCT  Date Value Ref Range Status  01/07/2014 40.0 36.0 - 46.0 % Final    Physical Exam:  General: alert, cooperative and no distress Lochia: appropriate Uterine Fundus: firm Incision: n/a DVT Evaluation: No evidence of DVT seen on physical exam.  Discharge Diagnoses: Term Pregnancy-delivered  Discharge Information: Date: 01/09/2014 Activity: pelvic rest Diet: routine Medications: Ibuprofen Condition: stable Instructions: refer to practice specific booklet Discharge to: home Follow-up Information    Follow up with [facility]. Schedule an appointment as soon as possible for a visit in 4 weeks.   Why:  postpartum checkup and nexplanon   Contact information:   [address] [place] [address]       Newborn Data: Live born female  Birth Weight: 5 lb 14.2 oz (2670 g) APGAR: 9, 9 In NICU for phototherapy   CRESENZO-DISHMAN,Janeah Kovacich 01/09/2014, 7:43 AM

## 2014-01-10 NOTE — Progress Notes (Signed)
Post discharge chart review completed.  

## 2014-01-15 ENCOUNTER — Ambulatory Visit: Payer: Self-pay

## 2014-01-15 NOTE — Lactation Note (Signed)
This note was copied from the chart of Victoria Fitzpatrick. Lactation Consultation Note Follow up consult with this mom of a NICU baby, now 76 days old, and 38 5/7 weeks CGA. Mom is pumping and has a good milk supply, but is only pumping 3 times a day. I explained how she will eventually lose her supply with only pumping 3 times a day, and advised her to pump at least every 4 hours at night, every 3 during the day.  Mom receptive to my advise. Patient Name: Victoria Cate Oravec [facility] Date: 01/15/2014 Reason for consult: Follow-up assessment   Maternal Data    Feeding Feeding Type: Bottle Fed - Breast Milk Nipple Type: Regular Length of feed: 30 min  LATCH Score/Interventions                      Lactation Tools Discussed/Used Pump Review: Setup, frequency, and cleaning   Consult Status Consult Status: PRN Follow-up type:  (NICU)    Alfred Levins 01/15/2014, 12:07 PM

## 2014-02-08 ENCOUNTER — Ambulatory Visit: Payer: Medicaid Other | Admitting: Obstetrics & Gynecology

## 2018-01-11 NOTE — L&D Delivery Note (Addendum)
Patient: Victoria Fitzpatrick MRN: [id number]  GBS status: neg, IAP given NA  Patient is a 29 y.o. now G3P3 s/p NSVD at [redacted]w[redacted]d, who was admitted for SOL. SROM 3h 50m prior to delivery with clear fluid.    Delivery Note At 11:33 PM a viable and healthy female was delivered via Vaginal, Spontaneous (Presentation:vertex;LOA).  APGAR: 9, 9; weight 2801 grams. Placenta status: delivered spontansously,intact.  Cord: 3-vessel with the following complications: nuchal cord which was reduced with somersault maneuver and compound hand.  Cord pH: pending  Anesthesia:  none Episiotomy: None Lacerations: 1st degree Suture Repair: 3.0 vicryl rapide Est. Blood Loss (mL): 100  Mom to postpartum.  Baby to Couplet care / Skin to Skin.  Chelsey L Anderson 12/07/2018, 12:04 AM   Head delivered LOA. No nuchal cord present. Shoulder and body delivered in usual fashion with compound arm and nuchal cord x1 easily reduced with somersault maneuver. Infant with spontaneous cry, placed on mother's abdomen, dried and bulb suctioned. Cord clamped x 2 after 1-minute delay, and cut by family member. Cord blood drawn. Placenta delivered spontaneously with gentle cord traction. Fundus firm with massage and Pitocin. Perineum inspected and found to have 1st degree laceration, which was repaired with 3.0 vicryl rapide single interrupted and subcutaneous with good hemostasis achieved.   OB FELLOW ATTESTATION  I was present, gloved, and supervising throughout the delivery and repair and agree with above documentation in the resident's note.  Augustin Coupe, MD/[facility] OB Fellow  12/07/2018, 12:57 AM

## 2018-05-08 ENCOUNTER — Ambulatory Visit (HOSPITAL_COMMUNITY)
Admission: EM | Admit: 2018-05-08 | Discharge: 2018-05-08 | Disposition: A | Discharge status: Home / Self Care | Payer: Self-pay | Attending: Family Medicine | Admitting: Family Medicine

## 2018-05-08 ENCOUNTER — Other Ambulatory Visit: Payer: Self-pay

## 2018-05-08 ENCOUNTER — Encounter (HOSPITAL_COMMUNITY): Payer: Self-pay

## 2018-05-08 DIAGNOSIS — J22 Unspecified acute lower respiratory infection: Secondary | ICD-10-CM

## 2018-05-08 MED ORDER — AZITHROMYCIN 250 MG PO TABS: 250.0000 mg | ORAL_TABLET | Freq: Every day | ORAL | 0 refills | Status: DC

## 2018-05-08 MED ORDER — AMOXICILLIN 500 MG PO CAPS: 500.0000 mg | ORAL_CAPSULE | Freq: Three times a day (TID) | ORAL | 0 refills | Status: AC

## 2018-05-08 NOTE — ED Triage Notes (Signed)
Pt states she has a cough that's has lasted longer then 2 months. Pt states she's coughing up mucus pt is [redacted] weeks pregnant .

## 2018-05-08 NOTE — Discharge Instructions (Signed)
Begin amoxicillin and azithromycin May try daily zyrtec/Claritin to help with any drainage contributing to symptoms Cough- may use over the counter deslym  Diclegis:  One-half of the 25 mg Unisom sleep tablet over-the-counter tablet or two chewable 5 mg tablets can be used off-label as an antiemetic. In addition, pyridoxine 25 mg, also available over-the-counter, is taken three or four times per day;This is a reasonable, less expensive substitute for combination tablets.  Follow up if developing fevers, chills, worsening cough, shortness of breath

## 2018-05-08 NOTE — ED Provider Notes (Signed)
[facility]    CSN: [id number] Arrival date & time: 05/08/18  1405     History   Chief Complaint Chief Complaint  Patient presents with  . Cough    HPI Aalliyah Faiella is a 28 y.o. female currently [redacted] weeks pregnant presenting today for evaluation of a cough.  Patient states that she has had a cough for the past 2.5 months.  States that cough is occasionally dry, occasional clear mucus, occasional thicker mucus.  Visit today she had increased fatigue from normal.  Denies any fever, chills or body aches.  Denies associated rhinorrhea congestion or sore throat.  Has had some mild morning sickness, but no persistent nausea vomiting, abdominal pain or diarrhea.  She has been trying some home remedies like ginger tea with minimal relief.  HPI  Past Medical History:  Diagnosis Date  . Medical history non-contributory     Patient Active Problem List   Diagnosis Date Noted  . Active labor at term 01/07/2014  . Prior poor obstetrical history, antepartum   . [redacted] weeks gestation of pregnancy   . Supervision of normal pregnancy, antepartum 08/15/2013    Past Surgical History:  Procedure Laterality Date  . NO PAST SURGERIES      OB History    Gravida  2   Para  2   Term  2   Preterm      AB      Living  2     SAB      TAB      Ectopic      Multiple  0   Live Births  2            Home Medications    Prior to Admission medications   Medication Sig Start Date End Date Taking? Authorizing Provider  amoxicillin (AMOXIL) 500 MG capsule Take 1 capsule (500 mg total) by mouth 3 (three) times daily for 7 days. 05/08/18 05/15/18  Wieters, Hallie C, PA-C  azithromycin (ZITHROMAX) 250 MG tablet Take 1 tablet (250 mg total) by mouth daily. Take first 2 tablets together, then 1 every day until finished. 05/08/18   Wieters, Hallie C, PA-C  ibuprofen (ADVIL,MOTRIN) 600 MG tablet Take 1 tablet (600 mg total) by mouth every 6 (six) hours. 01/09/14   Jacklyn Shellresenzo-Dishmon,  Frances, CNM    Family History History reviewed. No pertinent family history.  Social History Social History   Tobacco Use  . Smoking status: Never Smoker  . Smokeless tobacco: Never Used  Substance Use Topics  . Alcohol use: No  . Drug use: No     Allergies   Patient has no known allergies.   Review of Systems Review of Systems  Constitutional: Positive for fatigue. Negative for activity change, appetite change, chills and fever.  HENT: Negative for congestion, ear pain, rhinorrhea, sinus pressure, sore throat and trouble swallowing.   Eyes: Negative for discharge and redness.  Respiratory: Positive for cough. Negative for chest tightness and shortness of breath.   Cardiovascular: Negative for chest pain.  Gastrointestinal: Negative for abdominal pain, diarrhea, nausea and vomiting.  Musculoskeletal: Negative for myalgias.  Skin: Negative for rash.  Neurological: Negative for dizziness, light-headedness and headaches.     Physical Exam Triage Vital Signs ED Triage Vitals  Enc Vitals Group     BP 05/08/18 1422 119/68     Pulse Rate 05/08/18 1422 84     Resp 05/08/18 1422 18     Temp 05/08/18 1422 98.2 F (36.8  C)     Temp Source 05/08/18 1422 Oral     SpO2 05/08/18 1422 100 %     Weight 05/08/18 1424 105 lb (47.6 kg)     Height --      Head Circumference --      Peak Flow --      Pain Score 05/08/18 1424 1     Pain Loc --      Pain Edu? --      Excl. in GC? --    No data found.  Updated Vital Signs BP 119/68 (BP Location: Right Arm)   Pulse 84   Temp 98.2 F (36.8 C) (Oral)   Resp 18   Wt 105 lb (47.6 kg)   SpO2 100%   BMI 20.51 kg/m   Visual Acuity Right Eye Distance:   Left Eye Distance:   Bilateral Distance:    Right Eye Near:   Left Eye Near:    Bilateral Near:     Physical Exam Vitals signs and nursing note reviewed.  Constitutional:      General: She is not in acute distress.    Appearance: She is well-developed.  HENT:      Head: Normocephalic and atraumatic.     Ears:     Comments: Bilateral ears without tenderness to palpation of external auricle, tragus and mastoid, EAC's without erythema or swelling, TM's with good bony landmarks and cone of light. Non erythematous.     Mouth/Throat:     Comments: Oral mucosa pink and moist, no tonsillar enlargement or exudate. Posterior pharynx patent and nonerythematous, no uvula deviation or swelling. Normal phonation. Eyes:     Conjunctiva/sclera: Conjunctivae normal.  Neck:     Musculoskeletal: Neck supple.  Cardiovascular:     Rate and Rhythm: Normal rate and regular rhythm.     Heart sounds: No murmur.  Pulmonary:     Effort: Pulmonary effort is normal. No respiratory distress.     Breath sounds: Normal breath sounds.     Comments: Breathing comfortably at rest, CTABL, no wheezing, rales or other adventitious sounds auscultated Abdominal:     Palpations: Abdomen is soft.     Tenderness: There is no abdominal tenderness.  Skin:    General: Skin is warm and dry.  Neurological:     Mental Status: She is alert.      UC Treatments / Results  Labs (all labs ordered are listed, but only abnormal results are displayed) Labs Reviewed - No data to display  EKG None  Radiology No results found.  Procedures Procedures (including critical care time)  Medications Ordered in UC Medications - No data to display  Initial Impression / Assessment and Plan / UC Course  I have reviewed the triage vital signs and the nursing notes.  Pertinent labs & imaging results that were available during my care of the patient were reviewed by me and considered in my medical decision making (see chart for details).     28 year old female, breathing comfortably at rest, lungs clear, vital signs stable.  Cough x2.5 months.  Given patient pregnant will defer x-ray.  Will empirically treat for lower respiratory infection with amoxicillin plus azithromycin given recommendations  from up-to-date.  Discussed further over-the-counter medicine safe in pregnancy.  Discussed use of Unisom/B12 to help with morning sickness.  Continue to monitor breathing and symptoms,Discussed strict return precautions. Patient verbalized understanding and is agreeable with plan.  Final Clinical Impressions(s) / UC Diagnoses   Final diagnoses:  Lower  respiratory infection (e.g., bronchitis, pneumonia, pneumonitis, pulmonitis)     Discharge Instructions     Begin amoxicillin and azithromycin May try daily zyrtec/Claritin to help with any drainage contributing to symptoms Cough- may use over the counter deslym  Diclegis:  One-half of the 25 mg Unisom sleep tablet over-the-counter tablet or two chewable 5 mg tablets can be used off-label as an antiemetic. In addition, pyridoxine 25 mg, also available over-the-counter, is taken three or four times per day;This is a reasonable, less expensive substitute for combination tablets.  Follow up if developing fevers, chills, worsening cough, shortness of breath    ED Prescriptions    Medication Sig Dispense Auth. Provider   amoxicillin (AMOXIL) 500 MG capsule Take 1 capsule (500 mg total) by mouth 3 (three) times daily for 7 days. 21 capsule Wieters, Hallie C, PA-C   azithromycin (ZITHROMAX) 250 MG tablet Take 1 tablet (250 mg total) by mouth daily. Take first 2 tablets together, then 1 every day until finished. 6 tablet Wieters, [place] C, PA-C     Controlled Substance Prescriptions [place] Controlled Substance Registry consulted? Not Applicable   Lew Dawes, [place] 05/08/18 1507

## 2018-05-26 DIAGNOSIS — R824 Acetonuria: Secondary | ICD-10-CM | POA: Diagnosis not present

## 2018-05-26 DIAGNOSIS — Z0389 Encounter for observation for other suspected diseases and conditions ruled out: Secondary | ICD-10-CM | POA: Diagnosis not present

## 2018-05-26 DIAGNOSIS — Z1388 Encounter for screening for disorder due to exposure to contaminants: Secondary | ICD-10-CM | POA: Diagnosis not present

## 2018-05-26 DIAGNOSIS — Z3481 Encounter for supervision of other normal pregnancy, first trimester: Secondary | ICD-10-CM | POA: Diagnosis not present

## 2018-05-26 DIAGNOSIS — Z8759 Personal history of other complications of pregnancy, childbirth and the puerperium: Secondary | ICD-10-CM | POA: Diagnosis not present

## 2018-05-26 DIAGNOSIS — Z8611 Personal history of tuberculosis: Secondary | ICD-10-CM | POA: Diagnosis not present

## 2018-05-26 DIAGNOSIS — Z3009 Encounter for other general counseling and advice on contraception: Secondary | ICD-10-CM | POA: Diagnosis not present

## 2018-05-26 DIAGNOSIS — R87612 Low grade squamous intraepithelial lesion on cytologic smear of cervix (LGSIL): Secondary | ICD-10-CM | POA: Diagnosis not present

## 2018-05-26 LAB — OB RESULTS CONSOLE RPR: RPR: NONREACTIVE

## 2018-05-26 LAB — OB RESULTS CONSOLE PLATELET COUNT: Platelets: 235

## 2018-05-26 LAB — OB RESULTS CONSOLE ABO/RH: RH Type: POSITIVE

## 2018-05-26 LAB — CYTOLOGY - PAP
Glucose 1 Hour: 95
Urine Culture, OB: NORMAL

## 2018-05-26 LAB — OB RESULTS CONSOLE GC/CHLAMYDIA
Chlamydia: NEGATIVE
Gonorrhea: NEGATIVE

## 2018-05-26 LAB — HEMOGLOBIN EVAL RFX ELECTROPHORESIS: Hemoglobin Evaluation: NORMAL

## 2018-05-26 LAB — OB RESULTS CONSOLE HGB/HCT, BLOOD
HCT: 41 (ref 29–41)
Hemoglobin: 13.4

## 2018-05-26 LAB — OB RESULTS CONSOLE HEPATITIS B SURFACE ANTIGEN: Hepatitis B Surface Ag: NEGATIVE

## 2018-05-26 LAB — OB RESULTS CONSOLE ANTIBODY SCREEN: Antibody Screen: NEGATIVE

## 2018-05-26 LAB — OB RESULTS CONSOLE RUBELLA ANTIBODY, IGM: Rubella: IMMUNE

## 2018-05-26 LAB — OB RESULTS CONSOLE VARICELLA ZOSTER ANTIBODY, IGG: Varicella: IMMUNE

## 2018-05-26 LAB — OB RESULTS CONSOLE HIV ANTIBODY (ROUTINE TESTING): HIV: NONREACTIVE

## 2018-05-29 ENCOUNTER — Other Ambulatory Visit (HOSPITAL_COMMUNITY): Payer: Self-pay | Admitting: Nurse Practitioner

## 2018-05-29 DIAGNOSIS — Z369 Encounter for antenatal screening, unspecified: Secondary | ICD-10-CM

## 2018-05-31 ENCOUNTER — Telehealth: Payer: Self-pay | Admitting: Obstetrics & Gynecology

## 2018-05-31 NOTE — Telephone Encounter (Signed)
Attempted to reach patient on both numbers in Epic. Was not able to leave a voicemail. She needs to sign up for MyChart, and download the app.

## 2018-06-02 ENCOUNTER — Encounter (HOSPITAL_COMMUNITY): Payer: Self-pay | Admitting: *Deleted

## 2018-06-06 ENCOUNTER — Encounter: Payer: Self-pay | Admitting: *Deleted

## 2018-06-07 ENCOUNTER — Telehealth: Payer: Self-pay | Admitting: Obstetrics & Gynecology

## 2018-06-07 NOTE — Telephone Encounter (Signed)
Attempted to call patient on both numbers in Epic. Left a voicemail message for her to call us before her visit. We need to get her signed up for MyChart. I did send a link to her phone.

## 2018-06-08 ENCOUNTER — Other Ambulatory Visit: Payer: Self-pay

## 2018-06-08 ENCOUNTER — Ambulatory Visit (HOSPITAL_COMMUNITY)
Admission: RE | Admit: 2018-06-08 | Discharge: 2018-06-08 | Disposition: A | Discharge status: Home / Self Care | Payer: Medicaid Other | Source: Ambulatory Visit | Attending: Obstetrics and Gynecology | Admitting: Obstetrics and Gynecology

## 2018-06-08 ENCOUNTER — Other Ambulatory Visit: Payer: Medicaid Other

## 2018-06-08 ENCOUNTER — Ambulatory Visit (HOSPITAL_COMMUNITY): Payer: Medicaid Other | Admitting: *Deleted

## 2018-06-08 ENCOUNTER — Ambulatory Visit (HOSPITAL_COMMUNITY): Payer: Medicaid Other

## 2018-06-08 ENCOUNTER — Encounter (HOSPITAL_COMMUNITY): Payer: Self-pay

## 2018-06-08 VITALS — BP 115/83 | HR 93 | Temp 98.4°F | Wt 105.0 lb

## 2018-06-08 DIAGNOSIS — Z3682 Encounter for antenatal screening for nuchal translucency: Secondary | ICD-10-CM | POA: Diagnosis not present

## 2018-06-08 DIAGNOSIS — Z369 Encounter for antenatal screening, unspecified: Secondary | ICD-10-CM

## 2018-06-08 DIAGNOSIS — Z3A12 12 weeks gestation of pregnancy: Secondary | ICD-10-CM | POA: Diagnosis not present

## 2018-06-10 LAB — FIRST TRIMESTER SCREEN W/NT
CRL: 60.3 mm
DIA MoM: 0.73
DIA Value: 224.4 pg/mL
Gest Age-Collect: 12.3 weeks
Maternal Age At EDD: 29 yr
Nuchal Translucency MoM: 0.71
Nuchal Translucency: 1 mm
Number of Fetuses: 1
PAPP-A MoM: 0.86
PAPP-A Value: 1261.7 ng/mL
Test Results:: NEGATIVE
Weight: 105 [lb_av]
hCG MoM: 1.45
hCG Value: 176.5 IU/mL

## 2018-06-12 ENCOUNTER — Encounter: Payer: Self-pay | Admitting: *Deleted

## 2018-06-12 DIAGNOSIS — R87612 Low grade squamous intraepithelial lesion on cytologic smear of cervix (LGSIL): Secondary | ICD-10-CM

## 2018-06-12 DIAGNOSIS — O0990 Supervision of high risk pregnancy, unspecified, unspecified trimester: Secondary | ICD-10-CM

## 2018-06-12 DIAGNOSIS — Z87898 Personal history of other specified conditions: Secondary | ICD-10-CM

## 2018-06-15 ENCOUNTER — Telehealth: Payer: Self-pay | Admitting: Obstetrics and Gynecology

## 2018-06-15 NOTE — Telephone Encounter (Signed)
The patient called in to confirm the upcoming appointment. Informed of the mychart visit, sent the patient the link and informed of how to download the app.

## 2018-06-16 ENCOUNTER — Other Ambulatory Visit: Payer: Self-pay

## 2018-06-16 ENCOUNTER — Encounter: Payer: Self-pay | Admitting: Obstetrics & Gynecology

## 2018-06-16 ENCOUNTER — Telehealth (INDEPENDENT_AMBULATORY_CARE_PROVIDER_SITE_OTHER): Payer: Medicaid Other | Admitting: Obstetrics & Gynecology

## 2018-06-16 DIAGNOSIS — Z87898 Personal history of other specified conditions: Secondary | ICD-10-CM

## 2018-06-16 DIAGNOSIS — O26892 Other specified pregnancy related conditions, second trimester: Secondary | ICD-10-CM

## 2018-06-16 DIAGNOSIS — O0990 Supervision of high risk pregnancy, unspecified, unspecified trimester: Secondary | ICD-10-CM

## 2018-06-16 DIAGNOSIS — Z3A14 14 weeks gestation of pregnancy: Secondary | ICD-10-CM

## 2018-06-16 DIAGNOSIS — R87612 Low grade squamous intraepithelial lesion on cytologic smear of cervix (LGSIL): Secondary | ICD-10-CM

## 2018-06-16 NOTE — Progress Notes (Signed)
I connected with  Ottilie Camey on 06/16/18 at  9:55 AM EDT by telephone and verified that I am speaking with the correct person using two identifiers.   I discussed the limitations, risks, security and privacy concerns of performing an evaluation and management service by telephone and the availability of in person appointments. I also discussed with the patient that there may be a patient responsible charge related to this service. The patient expressed understanding and agreed to proceed.  Osvaldo Human, RN 06/16/2018  9:43 AM

## 2018-06-16 NOTE — Progress Notes (Signed)
  Subjective:    Victoria Fitzpatrick is being seen today for her first obstetrical visit. P2 transferred here [facility] health dept due to h/o small babies. This is not a planned pregnancy. She is at [redacted]w[redacted]d gestation. Her obstetrical history is significant for h/o small babies. Relationship with FOB: spouse, living together. Patient does intend to breast feed. Pregnancy history fully reviewed.  Patient reports no complaints.  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 Supervision of high risk pregnancy, antepartum 06/12/2018  . History of low birth weight 06/12/2018  . Low grade squamous intraepithelial lesion (LGSIL) on cervical Pap smear 06/12/2018       Plan:     Initial labs drawn. Prenatal vitamins. Problem list reviewed and updated. Role of ultrasound in pregnancy discussed; fetal survey: ordered. Amniocentesis discussed: not indicated. Follow up in 4 weeks with colpo Baby scripts    Allie Bossier 06/16/2018

## 2018-07-03 ENCOUNTER — Telehealth (INDEPENDENT_AMBULATORY_CARE_PROVIDER_SITE_OTHER): Payer: Medicaid Other | Admitting: Obstetrics & Gynecology

## 2018-07-03 DIAGNOSIS — O0990 Supervision of high risk pregnancy, unspecified, unspecified trimester: Secondary | ICD-10-CM

## 2018-07-03 NOTE — Telephone Encounter (Signed)
Called patient about her appointment. She requested to get a call back from the nurse. She has some questions.

## 2018-07-04 MED ORDER — PRENATAL VITAMIN 27-0.8 MG PO TABS: 1.0000 | ORAL_TABLET | Freq: Every day | ORAL | 9 refills | Status: DC

## 2018-07-04 NOTE — Addendum Note (Signed)
Addended by: Donn Pierini on: 07/04/2018 03:25 PM   Modules accepted: Orders

## 2018-07-04 NOTE — Telephone Encounter (Signed)
Called pt at request of front office staff to answer questions. Pt reports she needs her PNV order refilled. Order sent to her Pharmacy.

## 2018-07-17 ENCOUNTER — Other Ambulatory Visit: Payer: Self-pay

## 2018-07-17 ENCOUNTER — Other Ambulatory Visit (HOSPITAL_COMMUNITY)
Admission: RE | Admit: 2018-07-17 | Discharge: 2018-07-17 | Disposition: A | Discharge status: Home / Self Care | Payer: Medicaid Other | Source: Ambulatory Visit | Attending: Obstetrics & Gynecology | Admitting: Obstetrics & Gynecology

## 2018-07-17 ENCOUNTER — Encounter: Payer: Medicaid Other | Admitting: Obstetrics & Gynecology

## 2018-07-17 ENCOUNTER — Ambulatory Visit (INDEPENDENT_AMBULATORY_CARE_PROVIDER_SITE_OTHER): Payer: Medicaid Other | Admitting: Obstetrics & Gynecology

## 2018-07-17 VITALS — BP 108/67 | HR 80 | Temp 98.6°F | Wt 113.4 lb

## 2018-07-17 DIAGNOSIS — N898 Other specified noninflammatory disorders of vagina: Secondary | ICD-10-CM | POA: Insufficient documentation

## 2018-07-17 DIAGNOSIS — Z3A18 18 weeks gestation of pregnancy: Secondary | ICD-10-CM | POA: Diagnosis not present

## 2018-07-17 DIAGNOSIS — R87612 Low grade squamous intraepithelial lesion on cytologic smear of cervix (LGSIL): Secondary | ICD-10-CM

## 2018-07-17 NOTE — Progress Notes (Signed)
   Subjective:    Patient ID: Victoria Fitzpatrick, female    DOB: December 22, 1990, 28 y.o.   MRN: [id number]  HPI 28 yo G3P2 at [redacted] weeks EGA for a colpo due to LGSIL and a ROB visit. She has no concerns today.   Review of Systems     Objective:   Physical Exam Breathing, conversing, and ambulating normally UPT negative, consent signed, time out done Frothy vaginal discharge present Cervix prepped with acetic acid. Transformation zone seen in its entirety. Colpo adequate. Changes c/w LGSIL seen in a circumferencial fashion at the os (acetowhite changes) She tolerated the procedure well     Assessment & Plan:  Vaginal discharge- await wet prep LGSIL pap- c/w colpo finding, rec repeat pap postpartum Anatomy u/s scheduled  Virtual visit in 4 weeks

## 2018-07-18 ENCOUNTER — Telehealth: Payer: Self-pay | Admitting: *Deleted

## 2018-07-18 ENCOUNTER — Encounter: Payer: Self-pay | Admitting: *Deleted

## 2018-07-18 NOTE — Telephone Encounter (Signed)
Anatomy ultrasound scheduled for 1230 on 07/26/18.  Mychart message sent to pt.

## 2018-07-18 NOTE — Telephone Encounter (Signed)
-----   Message from Dolores Hoose, RN sent at 07/17/2018  2:56 PM EDT ----- Pt needs anatomy ultrasound scheduled.

## 2018-07-19 ENCOUNTER — Other Ambulatory Visit: Payer: Self-pay | Admitting: Obstetrics & Gynecology

## 2018-07-19 LAB — CERVICOVAGINAL ANCILLARY ONLY
Bacterial vaginitis: POSITIVE — AB
Candida vaginitis: NEGATIVE
Chlamydia: NEGATIVE
Neisseria Gonorrhea: NEGATIVE
Trichomonas: NEGATIVE

## 2018-07-19 MED ORDER — METRONIDAZOLE 500 MG PO TABS: 500.0000 mg | ORAL_TABLET | Freq: Two times a day (BID) | ORAL | 0 refills | Status: DC

## 2018-07-19 NOTE — Progress Notes (Unsigned)
Flagyl prescribed for BV seen on exam and confirmed with wet prep.

## 2018-07-22 ENCOUNTER — Other Ambulatory Visit: Payer: Self-pay | Admitting: Obstetrics & Gynecology

## 2018-07-22 ENCOUNTER — Other Ambulatory Visit: Payer: Self-pay

## 2018-07-22 ENCOUNTER — Other Ambulatory Visit (HOSPITAL_COMMUNITY): Payer: Self-pay | Admitting: *Deleted

## 2018-07-22 ENCOUNTER — Ambulatory Visit (HOSPITAL_COMMUNITY)
Admission: RE | Admit: 2018-07-22 | Discharge: 2018-07-22 | Disposition: A | Discharge status: Home / Self Care | Payer: Medicaid Other | Source: Ambulatory Visit | Attending: Obstetrics and Gynecology | Admitting: Obstetrics and Gynecology

## 2018-07-22 DIAGNOSIS — O0990 Supervision of high risk pregnancy, unspecified, unspecified trimester: Secondary | ICD-10-CM

## 2018-07-22 DIAGNOSIS — O09292 Supervision of pregnancy with other poor reproductive or obstetric history, second trimester: Secondary | ICD-10-CM | POA: Diagnosis not present

## 2018-07-22 DIAGNOSIS — Z3A19 19 weeks gestation of pregnancy: Secondary | ICD-10-CM

## 2018-07-22 DIAGNOSIS — Z3689 Encounter for other specified antenatal screening: Secondary | ICD-10-CM

## 2018-07-26 ENCOUNTER — Ambulatory Visit (HOSPITAL_COMMUNITY): Payer: Medicaid Other

## 2018-07-31 ENCOUNTER — Encounter: Payer: Self-pay | Admitting: Obstetrics & Gynecology

## 2018-07-31 ENCOUNTER — Other Ambulatory Visit: Payer: Self-pay

## 2018-07-31 ENCOUNTER — Telehealth (INDEPENDENT_AMBULATORY_CARE_PROVIDER_SITE_OTHER): Payer: Medicaid Other | Admitting: Obstetrics & Gynecology

## 2018-07-31 DIAGNOSIS — O26892 Other specified pregnancy related conditions, second trimester: Secondary | ICD-10-CM

## 2018-07-31 DIAGNOSIS — O0990 Supervision of high risk pregnancy, unspecified, unspecified trimester: Secondary | ICD-10-CM

## 2018-07-31 DIAGNOSIS — O0992 Supervision of high risk pregnancy, unspecified, second trimester: Secondary | ICD-10-CM

## 2018-07-31 DIAGNOSIS — R87612 Low grade squamous intraepithelial lesion on cytologic smear of cervix (LGSIL): Secondary | ICD-10-CM

## 2018-07-31 DIAGNOSIS — Z87898 Personal history of other specified conditions: Secondary | ICD-10-CM

## 2018-07-31 DIAGNOSIS — Z3A20 20 weeks gestation of pregnancy: Secondary | ICD-10-CM

## 2018-07-31 MED ORDER — AMBULATORY NON FORMULARY MEDICATION: 1.0000 | 0 refills | Status: DC

## 2018-07-31 NOTE — Progress Notes (Signed)
I connected with  Victoria Fitzpatrick on 07/31/18 at  1:35 PM EDT by telephone and verified that I am speaking with the correct person using two identifiers.   I discussed the limitations, risks, security and privacy concerns of performing an evaluation and management service by telephone and the availability of in person appointments. I also discussed with the patient that there may be a patient responsible charge related to this service. The patient expressed understanding and agreed to proceed.  Gould, CMA 07/31/2018  1:30 PM  Doesn't have her BP Cuff yet. Will place order today and have front office fax info to [facility].

## 2018-07-31 NOTE — Progress Notes (Signed)
TELEHEALTH OBSTETRICS PRENATAL VIRTUAL VIDEO VISIT ENCOUNTER NOTE  Provider location: [facility] [employer] [facility] at [facility]   I connected with Victoria Fitzpatrick on 07/31/18 at  1:35 PM EDT by MyChart Video Encounter at home and verified that I am speaking with the correct person using two identifiers.   I discussed the limitations, risks, security and privacy concerns of performing an evaluation and management service virtually and the availability of in person appointments. I also discussed with the patient that there may be a patient responsible charge related to this service. The patient expressed understanding and agreed to proceed. Subjective:  Victoria Fitzpatrick is a 28 y.o. G3P2002 at 64w3dbeing seen today for ongoing prenatal care.  She is currently monitored for the following issues for this low-risk pregnancy and has Supervision of high risk pregnancy, antepartum; History of low birth weight; and Low grade squamous intraepithelial lesion (LGSIL) on cervical Pap smear on their problem list.  Patient reports no complaints.  Contractions: Not present. Vag. Bleeding: None.  Movement: Present. Denies any leaking of fluid.   The following portions of the patient's history were reviewed and updated as appropriate: allergies, current medications, past family history, past medical history, past social history, past surgical history and problem list.   Objective:  There were no vitals filed for this visit.  Fetal Status:     Movement: Present     General:  Alert, oriented and cooperative. Patient is in no acute distress.  Respiratory: Normal respiratory effort, no problems with respiration noted  Mental Status: Normal mood and affect. Normal behavior. Normal judgment and thought content.  Rest of physical exam deferred due to type of encounter  Imaging: [place]Mfm Ob Detail +14 Wk  Result Date: 07/22/2018 ----------------------------------------------------------------------  OBSTETRICS REPORT                        (Signed Final 07/22/2018 09:19 am) ---------------------------------------------------------------------- Patient Info  ID #:       [id number]                         D.O.B.:  1Apr 06, 1992(27 yrs)  Name:       Victoria Fitzpatrick                     Visit Date: 07/22/2018 08:06 am ---------------------------------------------------------------------- Performed By  Performed By:     AHubert Azure         [address]. Address[address]     [address]  [place]  Attending:        Tama High MD        [facility]  Referred By:      Nicholaus Bloom                    MD ---------------------------------------------------------------------- Orders   #  Description                          Code         Ordered By   1  [place] MFM OB DETAIL +14 WK              76811.01     MYRA DOVE  ----------------------------------------------------------------------   #  Order #                    Accession #                 Episode #   1  [id number]                  [id number]                  [id number]  ---------------------------------------------------------------------- Indications   Poor obstetric history: Previous fetal growth  O09.299   restriction (FGR) (2041g '@birth'$ )   Encounter for antenatal screening for          Z36.3   malformations   [redacted] weeks gestation of pregnancy                Z3A.19  ---------------------------------------------------------------------- Vital Signs                                                 Height:        4'10" ---------------------------------------------------------------------- Fetal Evaluation  Num Of Fetuses:         1  Fetal Heart Rate(bpm):  158  Cardiac Activity:       Observed   Presentation:           Transverse, head to maternal left  Placenta:               Posterior  P. Cord Insertion:      Visualized  Amniotic Fluid  AFI FV:      Within normal limits                              Largest Pocket(cm)                              4.12 ---------------------------------------------------------------------- Biometry  BPD:      38.7  mm     G. Age:  17w 5d          6  %    CI:  71.53   %    70 - 86                                                          FL/HC:      18.1   %    16.1 - 18.3  HC:      145.7  mm     G. Age:  17w 5d          2  %    HC/AC:      1.08        1.09 - 1.39  AC:      134.9  mm     G. Age:  19w 0d         27  %    FL/BPD:     68.2   %  FL:       26.4  mm     G. Age:  18w 0d         10  %    FL/AC:      19.6   %    20 - 24  HUM:      27.9  mm     G. Age:  19w 0d         46  %  CER:      19.5  mm     G. Age:  18w 5d         40  %  NFT:       4.5  mm  LV:        5.8  mm  CM:          5  mm  Est. FW:     239  gm      0 lb 8 oz     12  % ---------------------------------------------------------------------- OB History  Gravidity:    3         Term:   2  Living:       2 ---------------------------------------------------------------------- Gestational Age  LMP:           19w 1d        Date:  03/10/18                 EDD:   12/15/18  U/S Today:     18w 1d                                        EDD:   12/22/18  Best:          19w 1d     Det. By:  LMP  (03/10/18)          EDD:   12/15/18 ---------------------------------------------------------------------- Anatomy  Cranium:               Appears normal         Aortic Arch:            Appears normal  Cavum:                 Appears normal         Ductal Arch:            limited views  normal  Ventricles:            Appears normal         Diaphragm:              Appears normal  Choroid Plexus:        Appears normal         Stomach:                Appears normal, left                                                                         sided  Cerebellum:            Appears normal         Abdomen:                Appears normal  Posterior Fossa:       Appears normal         Abdominal Wall:         Appears nml (cord                                                                        insert, abd wall)  Nuchal Fold:           Appears normal         Cord Vessels:           Appears normal (3                                                                        vessel cord)  Face:                  Appears normal         Kidneys:                Appear normal                         (orbits and profile)  Lips:                  Appears normal         Bladder:                Appears normal  Thoracic:              Appears normal         Spine:                  Appears normal  Heart:                 Appears normal  Upper Extremities:      Appears normal                         (4CH, axis, and                         situs)  RVOT:                  Appears normal         Lower Extremities:      Appears normal  LVOT:                  Appears normal  Other:  Technically difficult due to fetal position. Fetus appears to be female.          Heels and lt 5th digit visualized. ---------------------------------------------------------------------- Impression  Patient returned for fetal anatomy scan. On first-trimester  screening, the risks of fetal aneuploidies were not increased.  We performed fetal anatomy scan. No makers of  aneuploidies or fetal structural defects are seen. Fetal  biometry is consistent with her previously-established dates.  Amniotic fluid is normal and good fetal activity is seen.  Patient understands the limitations of ultrasound in detecting  fetal anomalies.  She had 2 previous term vaginal deliveries and they weighed  2,041g and 2,670 g. Both her children are in good health. ---------------------------------------------------------------------- Recommendations  -Fetal growth assessment at 32 weeks.  ----------------------------------------------------------------------                  Tama High, MD Electronically Signed Final Report   07/22/2018 09:19 am ----------------------------------------------------------------------   Assessment and Plan:  Pregnancy: [id number] at 63w3d1. Supervision of high risk pregnancy, antepartum Pt has labs drawn at the [facility] on 05/26/2018 no results seen for HIV or HepB. Need to call. If not available, redraw. Pt also needs AFP  - AMBULATORY NON FORMULARY MEDICATION; 1 Device by Other route once a week. 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 History of low birth weight Repeat Growth scan at 32 weeks.   - AMBULATORY NON FORMULARY MEDICATION; 1 Device by Other route once a week. Blood Pressure Cuff/ Small Monitored Regularly at home ICD 10: O09.90  Dispense: 1 kit; Refill: 0  3. Low grade squamous intraepithelial lesion (LGSIL) on cervical Pap smear Repeat PAP  Preterm labor symptoms and general obstetric precautions including but not limited to vaginal bleeding, contractions, leaking of fluid and fetal movement were reviewed in detail with the patient. I discussed the assessment and treatment plan with the patient. The patient was provided an opportunity to ask questions and all were answered. The patient agreed with the plan and demonstrated an understanding of the instructions. The patient was advised to call back or seek an in-person office evaluation/[facility]. Please refer to After Visit Summary for other counseling recommendations.   I provided 15 minutes of face-to-face time during this encounter.  No follow-ups on file.  Future Appointments  Date Time Provider [place] 08/28/2018  1:15 PM Anyanwu, USallyanne Havers [facility]  09/25/2018  8:20 AM WOC-WOCA LAB WOC-WOCA WOC  10/17/2018  8:45 AM [place]  WH-MFCUS MFC-US    CLavonia Drafts MD [facility]

## 2018-08-14 ENCOUNTER — Telehealth: Payer: Self-pay | Admitting: Emergency Medicine

## 2018-08-14 NOTE — Telephone Encounter (Signed)
Pt called and left a message on the nurse voicemail line stating that she needed a refill on her prenatal vitamins.   Pt chart reviewed and pt call was returned. Pt pharmacy was verified and pt was informed that she had 9 refills for her prenatal vitamins at the pharmacy. Pt verbalized understanding and had no further questions.

## 2018-08-28 ENCOUNTER — Other Ambulatory Visit: Payer: Self-pay

## 2018-08-28 ENCOUNTER — Telehealth (INDEPENDENT_AMBULATORY_CARE_PROVIDER_SITE_OTHER): Payer: Medicaid Other | Admitting: Obstetrics & Gynecology

## 2018-08-28 DIAGNOSIS — O0990 Supervision of high risk pregnancy, unspecified, unspecified trimester: Secondary | ICD-10-CM

## 2018-08-28 DIAGNOSIS — Z3A24 24 weeks gestation of pregnancy: Secondary | ICD-10-CM

## 2018-08-28 DIAGNOSIS — Z87898 Personal history of other specified conditions: Secondary | ICD-10-CM

## 2018-08-28 DIAGNOSIS — O0992 Supervision of high risk pregnancy, unspecified, second trimester: Secondary | ICD-10-CM

## 2018-08-28 DIAGNOSIS — R87612 Low grade squamous intraepithelial lesion on cytologic smear of cervix (LGSIL): Secondary | ICD-10-CM

## 2018-08-28 MED ORDER — BLOOD PRESSURE KIT DEVI: 1.0000 | 0 refills | Status: DC

## 2018-08-28 NOTE — Progress Notes (Signed)
   TELEHEALTH OBSTETRICS PRENATAL VIRTUAL VIDEO VISIT ENCOUNTER NOTE  Provider location: [facility] [employer] at [facility]   I connected with Victoria Fitzpatrick on 08/28/18 at  1:15 PM EDT by WebEx MyChart Video Encounter at home and verified that I am speaking with the correct person using two identifiers.   I discussed the limitations, risks, security and privacy concerns of performing an evaluation and management service virtually and the availability of in person appointments. I also discussed with the patient that there may be a patient responsible charge related to this service. The patient expressed understanding and agreed to proceed. Subjective:  Victoria Fitzpatrick is a 28 y.o. G3P2002 at [redacted]w[redacted]d being seen today for ongoing prenatal care.  She is currently monitored for the following issues for this low-risk pregnancy and has Supervision of high risk pregnancy, antepartum; History of low birth weight; and Low grade squamous intraepithelial lesion (LGSIL) on cervical Pap smear on their problem list.  Patient reports she is feeling well and denies any complaints.  Contractions: Not present. Vag. Bleeding: None.  Movement: Present. Denies any leaking of fluid.   The following portions of the patient's history were reviewed and updated as appropriate: allergies, current medications, past family history, past medical history, past social history, past surgical history and problem list.   Objective:  There were no vitals filed for this visit. Patient has not yet received BP cuff.   Fetal Status:     Movement: Present     General:  Alert, oriented and cooperative. Patient is in no acute distress.  Respiratory: Normal respiratory effort, no problems with respiration noted  Mental Status: Normal mood and affect. Normal behavior. Normal judgment and thought content.  Rest of physical exam deferred due to type of encounter  Imaging: No results found.  Assessment and Plan:  Pregnancy: G3P2002 at  [redacted]w[redacted]d 1. Supervision of high risk pregnancy, antepartum - previously followed at [facility] - called [facility] and left message with respect to Hep B and HIV labs that were supposedly drawn there - 28 week labs ordered for next visit including Hep B in case this was not done at [facility] - next growth scan on 10/6 - f/u in person in 4 weeks  - patient has not yet decided on [place] is working on blood pressure cuff  2. History of low birth weight - next growth scan on 10/6  3. Low grade squamous intraepithelial lesion (LGSIL) on cervical Pap smear - Pap indicated post-partum   Preterm labor symptoms and general obstetric precautions including but not limited to vaginal bleeding, contractions, leaking of fluid and fetal movement were reviewed in detail with the patient. I discussed the assessment and treatment plan with the patient. The patient was provided an opportunity to ask questions and all were answered. The patient agreed with the plan and demonstrated an understanding of the instructions. The patient was advised to call back or seek an in-person office evaluation/go to MAU at [facility] for any urgent or concerning symptoms. Please refer to After Visit Summary for other counseling recommendations.   I provided 15 minutes of face-to-face time during this encounter.  Return in about 4 weeks (around 09/25/2018) for [facility] in person.  Future Appointments  Date Time Provider [place]  09/25/2018  8:20 AM WOC-WOCA LAB WOC-WOCA WOC  10/17/2018  8:45 AM [place] 2 WH-MFCUS MFC-US    Chauncey Mann, MD [facility] [employer], [place]

## 2018-08-28 NOTE — Addendum Note (Signed)
Addended by: Bethanne Ginger on: 08/28/2018 05:08 PM   Modules accepted: Orders

## 2018-08-29 DIAGNOSIS — Z3490 Encounter for supervision of normal pregnancy, unspecified, unspecified trimester: Secondary | ICD-10-CM | POA: Diagnosis not present

## 2018-08-31 ENCOUNTER — Encounter: Payer: Self-pay | Admitting: *Deleted

## 2018-08-31 DIAGNOSIS — R87612 Low grade squamous intraepithelial lesion on cytologic smear of cervix (LGSIL): Secondary | ICD-10-CM

## 2018-08-31 DIAGNOSIS — Z87898 Personal history of other specified conditions: Secondary | ICD-10-CM

## 2018-08-31 DIAGNOSIS — O0990 Supervision of high risk pregnancy, unspecified, unspecified trimester: Secondary | ICD-10-CM

## 2018-09-08 DIAGNOSIS — O0990 Supervision of high risk pregnancy, unspecified, unspecified trimester: Secondary | ICD-10-CM | POA: Diagnosis not present

## 2018-09-25 ENCOUNTER — Other Ambulatory Visit: Payer: Medicaid Other

## 2018-09-25 ENCOUNTER — Telehealth: Payer: Self-pay | Admitting: Obstetrics and Gynecology

## 2018-09-25 NOTE — Telephone Encounter (Signed)
The patient called in to reschedule the appointment. She stated she thought her appointment was at [place]. Rescheduled.

## 2018-09-27 ENCOUNTER — Other Ambulatory Visit: Payer: Medicaid Other

## 2018-09-27 ENCOUNTER — Other Ambulatory Visit: Payer: Self-pay

## 2018-09-27 DIAGNOSIS — O0990 Supervision of high risk pregnancy, unspecified, unspecified trimester: Secondary | ICD-10-CM | POA: Diagnosis not present

## 2018-09-28 LAB — GLUCOSE TOLERANCE, 2 HOURS W/ 1HR
Glucose, 1 hour: 100 mg/dL (ref 65–179)
Glucose, 2 hour: 71 mg/dL (ref 65–152)
Glucose, Fasting: 70 mg/dL (ref 65–91)

## 2018-09-28 LAB — CBC
Hematocrit: 34.4 % (ref 34.0–46.6)
Hemoglobin: 11.7 g/dL (ref 11.1–15.9)
MCH: 31.5 pg (ref 26.6–33.0)
MCHC: 34 g/dL (ref 31.5–35.7)
MCV: 93 fL (ref 79–97)
Platelets: 220 10*3/uL (ref 150–450)
RBC: 3.72 x10E6/uL — ABNORMAL LOW (ref 3.77–5.28)
RDW: 13.3 % (ref 11.7–15.4)
WBC: 6.8 10*3/uL (ref 3.4–10.8)

## 2018-09-28 LAB — RPR: RPR Ser Ql: NONREACTIVE

## 2018-09-28 LAB — HIV ANTIBODY (ROUTINE TESTING W REFLEX): HIV Screen 4th Generation wRfx: NONREACTIVE

## 2018-09-28 LAB — HEPATITIS B SURFACE ANTIGEN: Hepatitis B Surface Ag: NEGATIVE

## 2018-10-11 ENCOUNTER — Telehealth: Payer: Self-pay | Admitting: Women's Health

## 2018-10-11 NOTE — Telephone Encounter (Signed)
Spoke to patient about her appointment on 10/1 @ 11:15. Patient instructed to wear a face mask for the entire appointment and no visitors are allowed with her during the visit. Patient screened for covid symptoms and denied having any

## 2018-10-12 ENCOUNTER — Other Ambulatory Visit: Payer: Self-pay

## 2018-10-12 ENCOUNTER — Ambulatory Visit (INDEPENDENT_AMBULATORY_CARE_PROVIDER_SITE_OTHER): Payer: Medicaid Other | Admitting: Women's Health

## 2018-10-12 VITALS — BP 120/82 | HR 101 | Temp 98.6°F | Wt 131.4 lb

## 2018-10-12 DIAGNOSIS — Z23 Encounter for immunization: Secondary | ICD-10-CM | POA: Diagnosis not present

## 2018-10-12 DIAGNOSIS — Z87898 Personal history of other specified conditions: Secondary | ICD-10-CM

## 2018-10-12 DIAGNOSIS — O0990 Supervision of high risk pregnancy, unspecified, unspecified trimester: Secondary | ICD-10-CM

## 2018-10-12 DIAGNOSIS — O0993 Supervision of high risk pregnancy, unspecified, third trimester: Secondary | ICD-10-CM

## 2018-10-12 DIAGNOSIS — Z3A30 30 weeks gestation of pregnancy: Secondary | ICD-10-CM

## 2018-10-12 NOTE — Patient Instructions (Addendum)
Laparoscopic Tubal Ligation Laparoscopic tubal ligation is a procedure to close the fallopian tubes. This is done so that you cannot get pregnant. When the fallopian tubes are closed, the eggs that your ovaries release cannot enter the uterus, and sperm cannot reach the released eggs. You should not have this procedure if you want to get pregnant someday or if you are unsure about having more children. Tell a health care provider about:  Any allergies you have.  All medicines you are taking, including vitamins, herbs, eye drops, creams, and over-the-counter medicines.  Any problems you or family members have had with anesthetic medicines.  Any blood disorders you have.  Any surgeries you have had.  Any medical conditions you have.  Whether you are pregnant or may be pregnant.  Any past pregnancies. What are the risks? Generally, this is a safe procedure. However, problems may occur, including:  Infection.  Bleeding.  Injury to other organs in the abdomen.  Side effects from anesthetic medicines.  Failure of the procedure. This procedure can increase your risk of a kind of pregnancy in which a fertilized egg attaches to the outside of the uterus (ectopic pregnancy). What happens before the procedure? Medicines  Ask your health care provider about: ? Changing or stopping your regular medicines. This is especially important if you are taking diabetes medicines or blood thinners. ? Taking medicines such as aspirin and ibuprofen. These medicines can thin your blood. Do not take these medicines unless your health care provider tells you to take them. ? Taking over-the-counter medicines, vitamins, herbs, and supplements. Staying hydrated  Follow instructions from your health care provider about hydration, which may include: ? Up to 2 hours before the procedure - you may continue to drink clear liquids, such as water, clear fruit juice, black coffee, and plain tea. Eating and  drinking  Follow instructions from your health care provider about eating and drinking, which may include: ? 8 hours before the procedure - stop eating heavy meals or foods, such as meat, fried foods, or fatty foods. ? 6 hours before the procedure - stop eating light meals or foods, such as toast or cereal. ? 6 hours before the procedure - stop drinking milk or drinks that contain milk. ? 2 hours before the procedure - stop drinking clear liquids. General instructions  Do not use any products that contain nicotine or tobacco for at least 4 weeks before the procedure. These products include cigarettes, e-cigarettes, and chewing tobacco. If you need help quitting, ask your health care provider.  Plan to have someone take you home from the hospital.  If you will be going home right after the procedure, plan to have someone with you for 24 hours.  Ask your health care provider: ? How your surgery site will be marked. ? What steps will be taken to help prevent infection. These may include:  Removing hair at the surgery site.  Washing skin with a germ-killing soap.  Taking antibiotic medicine. What happens during the procedure?      An IV will be inserted into one of your veins.  You will be given one or more of the following: ? A medicine to help you relax (sedative). ? A medicine to numb the area (local anesthetic). ? A medicine to make you fall asleep (general anesthetic). ? A medicine that is injected into an area of your body to numb everything below the injection site (regional anesthetic).  Your bladder may be emptied with a small tube (  catheter).  If you have been given a general anesthetic, a tube will be put down your throat to help you breathe.  Two small incisions will be made in your lower abdomen and near your belly button.  Your abdomen will be inflated with a gas. This will let the surgeon see better and will give the surgeon room to work.  A thin, lighted tube  (laparoscope) with a camera attached will be inserted into your abdomen through one of the incisions. Small instruments will be inserted through the other incision.  The fallopian tubes will be tied off, burned (cauterized), or blocked with a clip, ring, or clamp. A small portion in the center of each fallopian tube may be removed.  The gas will be released from the abdomen.  The incisions will be closed with stitches (sutures).  A bandage (dressing) will be placed over the incisions. The procedure may vary among health care providers and hospitals. What happens after the procedure?  Your blood pressure, heart rate, breathing rate, and blood oxygen level will be monitored until you leave the hospital.  You will be given medicine to help with pain, nausea, and vomiting as needed. Summary  Laparoscopic tubal ligation is a procedure that is done so that you cannot get pregnant.  You should not have this procedure if you want to get pregnant someday or if you are unsure about having more children.  The procedure is done using a thin, lighted tube (laparoscope) with a camera attached that will be inserted into your abdomen through an incision.  Follow instructions from your health care provider about eating and drinking before the procedure. This information is not intended to replace advice given to you by your health care provider. Make sure you discuss any questions you have with your health care provider. Document Released: 04/05/2000 Document Revised: 11/22/2017 Document Reviewed: 11/22/2017 Elsevier Patient Education  2020 [employer]. Etonogestrel implant What is this medicine? ETONOGESTREL (et oh noe JES trel) is a contraceptive (birth control) device. It is used to prevent pregnancy. It can be used for up to 3 years. This medicine may be used for other purposes; ask your health care provider or pharmacist if you have questions. COMMON BRAND NAME(S): Implanon, Nexplanon What  should I tell my health care provider before I take this medicine? They need to know if you have any of these conditions:  abnormal vaginal bleeding  blood vessel disease or blood clots  breast, cervical, endometrial, ovarian, liver, or uterine cancer  diabetes  gallbladder disease  heart disease or recent heart attack  high blood pressure  high cholesterol or triglycerides  kidney disease  liver disease  migraine headaches  seizures  stroke  tobacco smoker  an unusual or allergic reaction to etonogestrel, anesthetics or antiseptics, other medicines, foods, dyes, or preservatives  pregnant or trying to get pregnant  breast-feeding How should I use this medicine? This device is inserted just under the skin on the inner side of your upper arm by a health care professional. Talk to your pediatrician regarding the use of this medicine in children. Special care may be needed. Overdosage: If you think you have taken too much of this medicine contact a poison control center or emergency room at once. NOTE: This medicine is only for you. Do not share this medicine with others. What if I miss a dose? This does not apply. What may interact with this medicine? Do not take this medicine with any of the following medications:  amprenavir  fosamprenavir This medicine may also interact with the following medications:  acitretin  aprepitant  armodafinil  bexarotene  bosentan  carbamazepine  certain medicines for fungal infections like fluconazole, ketoconazole, itraconazole and voriconazole  certain medicines to treat hepatitis, HIV or AIDS  cyclosporine  felbamate  griseofulvin  lamotrigine  modafinil  oxcarbazepine  phenobarbital  phenytoin  primidone  rifabutin  rifampin  rifapentine  St. John's wort  topiramate This list may not describe all possible interactions. Give your health care provider a list of all the medicines, herbs,  non-prescription drugs, or dietary supplements you use. Also tell them if you smoke, drink alcohol, or use illegal drugs. Some items may interact with your medicine. What should I watch for while using this medicine? This product does not protect you against HIV infection (AIDS) or other sexually transmitted diseases. You should be able to feel the implant by pressing your fingertips over the skin where it was inserted. Contact your doctor if you cannot feel the implant, and use a non-hormonal birth control method (such as condoms) until your doctor confirms that the implant is in place. Contact your doctor if you think that the implant may have broken or become bent while in your arm. You will receive a user card from your health care provider after the implant is inserted. The card is a record of the location of the implant in your upper arm and when it should be removed. Keep this card with your health records. What side effects may I notice from receiving this medicine? Side effects that you should report to your doctor or health care professional as soon as possible:  allergic reactions like skin rash, itching or hives, swelling of the face, lips, or tongue  breast lumps, breast tissue changes, or discharge  breathing problems  changes in emotions or moods  if you feel that the implant may have broken or bent while in your arm  high blood pressure  pain, irritation, swelling, or bruising at the insertion site  scar at site of insertion  signs of infection at the insertion site such as fever, and skin redness, pain or discharge  signs and symptoms of a blood clot such as breathing problems; changes in vision; chest pain; severe, sudden headache; pain, swelling, warmth in the leg; trouble speaking; sudden numbness or weakness of the face, arm or leg  signs and symptoms of liver injury like dark yellow or brown urine; general ill feeling or flu-like symptoms; light-colored stools; loss  of appetite; nausea; right upper belly pain; unusually weak or tired; yellowing of the eyes or skin  unusual vaginal bleeding, discharge Side effects that usually do not require medical attention (report to your doctor or health care professional if they continue or are bothersome):  acne  breast pain or tenderness  headache  irregular menstrual bleeding  nausea This list may not describe all possible side effects. Call your doctor for medical advice about side effects. You may report side effects to FDA at 1-800-FDA-1088. Where should I keep my medicine? This drug is given in a hospital or clinic and will not be stored at home. NOTE: This sheet is a summary. It may not cover all possible information. If you have questions about this medicine, talk to your doctor, pharmacist, or health care provider.  2020 Elsevier/Gold Standard (2016-11-16 14:11:42)  Preterm Labor and Birth Information  The normal length of a pregnancy is 39-41 weeks. Preterm labor is when labor starts before 37 completed  weeks of pregnancy. What are the risk factors for preterm labor? Preterm labor is more likely to occur in women who:  Have certain infections during pregnancy such as a bladder infection, sexually transmitted infection, or infection inside the uterus (chorioamnionitis).  Have a shorter-than-normal cervix.  Have gone into preterm labor before.  Have had surgery on their cervix.  Are younger than age 70 or older than age 13.  Are African American.  Are pregnant with twins or multiple babies (multiple gestation).  Take street drugs or smoke while pregnant.  Do not gain enough weight while pregnant.  Became pregnant shortly after having been pregnant. What are the symptoms of preterm labor? Symptoms of preterm labor include:  Cramps similar to those that can happen during a menstrual period. The cramps may happen with diarrhea.  Pain in the abdomen or lower back.  Regular uterine  contractions that may feel like tightening of the abdomen.  A feeling of increased pressure in the pelvis.  Increased watery or bloody mucus discharge from the vagina.  Water breaking (ruptured amniotic sac). Why is it important to recognize signs of preterm labor? It is important to recognize signs of preterm labor because babies who are born prematurely may not be fully developed. This can put them at an increased risk for:  Long-term (chronic) heart and lung problems.  Difficulty immediately after birth with regulating body systems, including blood sugar, body temperature, heart rate, and breathing rate.  Bleeding in the brain.  Cerebral palsy.  Learning difficulties.  Death. These risks are highest for babies who are born before 101 weeks of pregnancy. How is preterm labor treated? Treatment depends on the length of your pregnancy, your condition, and the health of your baby. It may involve:  Having a stitch (suture) placed in your cervix to prevent your cervix from opening too early (cerclage).  Taking or being given medicines, such as: ? Hormone medicines. These may be given early in pregnancy to help support the pregnancy. ? Medicine to stop contractions. ? Medicines to help mature the baby's lungs. These may be prescribed if the risk of delivery is high. ? Medicines to prevent your baby from developing cerebral palsy. If the labor happens before 34 weeks of pregnancy, you may need to stay in the hospital. What should I do if I think I am in preterm labor? If you think that you are going into preterm labor, call your health care provider right away. How can I prevent preterm labor in future pregnancies? To increase your chance of having a full-term pregnancy:  Do not use any tobacco products, such as cigarettes, chewing tobacco, and e-cigarettes. If you need help quitting, ask your health care provider.  Do not use street drugs or medicines that have not been prescribed  to you during your pregnancy.  Talk with your health care provider before taking any herbal supplements, even if you have been taking them regularly.  Make sure you gain a healthy amount of weight during your pregnancy.  Watch for infection. If you think that you might have an infection, get it checked right away.  Make sure to tell your health care provider if you have gone into preterm labor before. This information is not intended to replace advice given to you by your health care provider. Make sure you discuss any questions you have with your health care provider. Document Released: 03/20/2003 Document Revised: 04/21/2018 Document Reviewed: 05/21/2015 Elsevier Patient Education  2020 [employer].

## 2018-10-12 NOTE — Addendum Note (Signed)
Addended by: Fidela Juneau A on: 10/12/2018 12:18 PM   Modules accepted: Orders

## 2018-10-12 NOTE — Progress Notes (Signed)
Subjective:  Ellianna Boardman is a 28 y.o. G3P2002 at [redacted]w[redacted]d being seen today for ongoing prenatal care.  She is currently monitored for the following issues for this high-risk pregnancy and has Supervision of high risk pregnancy, antepartum; History of low birth weight; and Low grade squamous intraepithelial lesion (LGSIL) on cervical Pap smear on their problem list.  Patient reports no complaints.  Contractions: Not present. Vag. Bleeding: None.  Movement: Present. Denies leaking of fluid.   The following portions of the patient's history were reviewed and updated as appropriate: allergies, current medications, past family history, past medical history, past social history, past surgical history and problem list. Problem list updated.  Objective:   Vitals:   10/12/18 1128  BP: 120/82  Pulse: (!) 101  Temp: 98.6 F (37 C)  Weight: 131 lb 6.4 oz (59.6 kg)    Fetal Status: Fetal Heart Rate (bpm): 155 Fundal Height: 30 cm Movement: Present     General:  Alert, oriented and cooperative. Patient is in no acute distress.  Skin: Skin is warm and dry. No rash noted.   Cardiovascular: Normal heart rate noted  Respiratory: Normal respiratory effort, no problems with respiration noted  Abdomen: Soft, gravid, appropriate for gestational age. Pain/Pressure: Absent     Pelvic: Vag. Bleeding: None     Cervical exam deferred        Extremities: Normal range of motion.  Edema: None  Mental Status: Normal mood and affect. Normal behavior. Normal judgment and thought content.   Assessment and Plan:  Pregnancy: G3P2002 at [redacted]w[redacted]d  1. Supervision of high risk pregnancy, antepartum - flu vaccine - tdap vaccine today - patient considering Nexplanon vs. BTL - pt unsure if she desires additional children, information given, discussed permanency of BTL and advised pt that if she is unsure or desires additional children in the future, that BTL was not the best option for her  2. History of low birth weight - pt  aware of growth scan scheduled for 10/17/2018  Preterm labor symptoms and general obstetric precautions including but not limited to vaginal bleeding, contractions, leaking of fluid and fetal movement were reviewed in detail with the patient. Please refer to After Visit Summary for other counseling recommendations.  Return for Pt already has next appt scheduled.   Hyman Crossan, Gerrie Nordmann, NP

## 2018-10-17 ENCOUNTER — Other Ambulatory Visit: Payer: Self-pay

## 2018-10-17 ENCOUNTER — Other Ambulatory Visit (HOSPITAL_COMMUNITY): Payer: Self-pay | Admitting: *Deleted

## 2018-10-17 ENCOUNTER — Ambulatory Visit (HOSPITAL_COMMUNITY)
Admission: RE | Admit: 2018-10-17 | Discharge: 2018-10-17 | Disposition: A | Discharge status: Home / Self Care | Payer: Medicaid Other | Source: Ambulatory Visit | Attending: Obstetrics and Gynecology | Admitting: Obstetrics and Gynecology

## 2018-10-17 DIAGNOSIS — Z362 Encounter for other antenatal screening follow-up: Secondary | ICD-10-CM

## 2018-10-17 DIAGNOSIS — Z3689 Encounter for other specified antenatal screening: Secondary | ICD-10-CM | POA: Insufficient documentation

## 2018-10-17 DIAGNOSIS — O09299 Supervision of pregnancy with other poor reproductive or obstetric history, unspecified trimester: Secondary | ICD-10-CM | POA: Diagnosis not present

## 2018-10-17 DIAGNOSIS — Z3A31 31 weeks gestation of pregnancy: Secondary | ICD-10-CM | POA: Diagnosis not present

## 2018-10-17 DIAGNOSIS — O09293 Supervision of pregnancy with other poor reproductive or obstetric history, third trimester: Secondary | ICD-10-CM

## 2018-11-09 ENCOUNTER — Ambulatory Visit (INDEPENDENT_AMBULATORY_CARE_PROVIDER_SITE_OTHER): Payer: Medicaid Other | Admitting: Obstetrics & Gynecology

## 2018-11-09 ENCOUNTER — Other Ambulatory Visit: Payer: Self-pay

## 2018-11-09 VITALS — BP 120/86 | HR 88 | Wt 137.6 lb

## 2018-11-09 DIAGNOSIS — O0993 Supervision of high risk pregnancy, unspecified, third trimester: Secondary | ICD-10-CM

## 2018-11-09 DIAGNOSIS — Z87898 Personal history of other specified conditions: Secondary | ICD-10-CM

## 2018-11-09 DIAGNOSIS — Z3A34 34 weeks gestation of pregnancy: Secondary | ICD-10-CM

## 2018-11-09 DIAGNOSIS — O0990 Supervision of high risk pregnancy, unspecified, unspecified trimester: Secondary | ICD-10-CM

## 2018-11-09 NOTE — Progress Notes (Signed)
   PRENATAL VISIT NOTE  Subjective:  Victoria Fitzpatrick is a 28 y.o. G3P2002 at [redacted]w[redacted]d being seen today for ongoing prenatal care.  She is currently monitored for the following issues for this high-risk pregnancy and has Supervision of high risk pregnancy, antepartum; History of low birth weight; and Low grade squamous intraepithelial lesion (LGSIL) on cervical Pap smear on their problem list.  Patient reports no complaints.  Contractions: Irritability. Vag. Bleeding: None.  Movement: Present. Denies leaking of fluid.   The following portions of the patient's history were reviewed and updated as appropriate: allergies, current medications, past family history, past medical history, past social history, past surgical history and problem list.   Objective:   Vitals:   11/09/18 0939  BP: 120/86  Pulse: 88  Weight: 137 lb 9.6 oz (62.4 kg)    Fetal Status: Fetal Heart Rate (bpm): 154   Movement: Present     General:  Alert, oriented and cooperative. Patient is in no acute distress.  Skin: Skin is warm and dry. No rash noted.   Cardiovascular: Normal heart rate noted  Respiratory: Normal respiratory effort, no problems with respiration noted  Abdomen: Soft, gravid, appropriate for gestational age.  Pain/Pressure: Present     Pelvic: Cervical exam deferred        Extremities: Normal range of motion.  Edema: None  Mental Status: Normal mood and affect. Normal behavior. Normal judgment and thought content.   Assessment and Plan:  Pregnancy: G3P2002 at [redacted]w[redacted]d 1. Supervision of high risk pregnancy, antepartum Doing well  2. History of low birth weight F/u [place] in 1 week  Preterm labor symptoms and general obstetric precautions including but not limited to vaginal bleeding, contractions, leaking of fluid and fetal movement were reviewed in detail with the patient. Please refer to After Visit Summary for other counseling recommendations.   Return in about 2 weeks (around 11/23/2018).  Future  Appointments  Date Time Provider [place]  11/14/2018  8:15 AM [place] NURSE [place] [facility]  11/14/2018  8:15 AM [place] 4 [facility]  11/23/2018  9:35 AM [place] Mode, MD [facility]  12/06/2018  9:35 AM [place] Mode, MD [facility]    Emeterio Reeve, MD

## 2018-11-09 NOTE — Patient Instructions (Signed)

## 2018-11-14 ENCOUNTER — Ambulatory Visit (HOSPITAL_COMMUNITY): Payer: Medicaid Other | Admitting: *Deleted

## 2018-11-14 ENCOUNTER — Encounter (HOSPITAL_COMMUNITY): Payer: Self-pay

## 2018-11-14 ENCOUNTER — Ambulatory Visit (HOSPITAL_COMMUNITY)
Admission: RE | Admit: 2018-11-14 | Discharge: 2018-11-14 | Disposition: A | Discharge status: Home / Self Care | Payer: Medicaid Other | Source: Ambulatory Visit | Attending: Obstetrics and Gynecology | Admitting: Obstetrics and Gynecology

## 2018-11-14 ENCOUNTER — Other Ambulatory Visit: Payer: Self-pay

## 2018-11-14 DIAGNOSIS — O0990 Supervision of high risk pregnancy, unspecified, unspecified trimester: Secondary | ICD-10-CM | POA: Diagnosis not present

## 2018-11-14 DIAGNOSIS — R87612 Low grade squamous intraepithelial lesion on cytologic smear of cervix (LGSIL): Secondary | ICD-10-CM | POA: Insufficient documentation

## 2018-11-14 DIAGNOSIS — Z362 Encounter for other antenatal screening follow-up: Secondary | ICD-10-CM | POA: Diagnosis not present

## 2018-11-14 DIAGNOSIS — Z3A35 35 weeks gestation of pregnancy: Secondary | ICD-10-CM

## 2018-11-14 DIAGNOSIS — O09293 Supervision of pregnancy with other poor reproductive or obstetric history, third trimester: Secondary | ICD-10-CM | POA: Diagnosis not present

## 2018-11-14 DIAGNOSIS — Z87898 Personal history of other specified conditions: Secondary | ICD-10-CM

## 2018-11-23 ENCOUNTER — Other Ambulatory Visit (HOSPITAL_COMMUNITY)
Admission: RE | Admit: 2018-11-23 | Discharge: 2018-11-23 | Disposition: A | Discharge status: Home / Self Care | Payer: Medicaid Other | Source: Ambulatory Visit | Attending: Obstetrics & Gynecology | Admitting: Obstetrics & Gynecology

## 2018-11-23 ENCOUNTER — Ambulatory Visit (INDEPENDENT_AMBULATORY_CARE_PROVIDER_SITE_OTHER): Payer: Medicaid Other | Admitting: Obstetrics & Gynecology

## 2018-11-23 ENCOUNTER — Other Ambulatory Visit: Payer: Self-pay

## 2018-11-23 VITALS — BP 116/86 | HR 94 | Temp 98.3°F | Wt 142.1 lb

## 2018-11-23 DIAGNOSIS — Z3A36 36 weeks gestation of pregnancy: Secondary | ICD-10-CM

## 2018-11-23 DIAGNOSIS — O0990 Supervision of high risk pregnancy, unspecified, unspecified trimester: Secondary | ICD-10-CM | POA: Diagnosis not present

## 2018-11-23 DIAGNOSIS — O0993 Supervision of high risk pregnancy, unspecified, third trimester: Secondary | ICD-10-CM

## 2018-11-23 DIAGNOSIS — Z87898 Personal history of other specified conditions: Secondary | ICD-10-CM

## 2018-11-23 NOTE — Progress Notes (Signed)
   PRENATAL VISIT NOTE  Subjective:  Victoria Fitzpatrick is a 28 y.o. G3P2002 at [redacted]w[redacted]d being seen today for ongoing prenatal care.  She is currently monitored for the following issues for this high-risk pregnancy and has Supervision of high risk pregnancy, antepartum; History of low birth weight; and Low grade squamous intraepithelial lesion (LGSIL) on cervical Pap smear on their problem list.  Patient reports no complaints.  Contractions: Irritability. Vag. Bleeding: None.  Movement: Present. Denies leaking of fluid.   The following portions of the patient's history were reviewed and updated as appropriate: allergies, current medications, past family history, past medical history, past social history, past surgical history and problem list.   Objective:   Vitals:   11/23/18 0939  BP: 116/86  Pulse: 94  Temp: 98.3 F (36.8 C)  Weight: 142 lb 1.6 oz (64.5 kg)    Fetal Status: Fetal Heart Rate (bpm): 142 Fundal Height: 35 cm Movement: Present  Presentation: Vertex  General:  Alert, oriented and cooperative. Patient is in no acute distress.  Skin: Skin is warm and dry. No rash noted.   Cardiovascular: Normal heart rate noted  Respiratory: Normal respiratory effort, no problems with respiration noted  Abdomen: Soft, gravid, appropriate for gestational age.  Pain/Pressure: Present     Pelvic: Cervical exam performed Dilation: 1 Effacement (%): 40 Station: -3  Extremities: Normal range of motion.  Edema: None  Mental Status: Normal mood and affect. Normal behavior. Normal judgment and thought content.   Assessment and Plan:  Pregnancy: G3P2002 at [redacted]w[redacted]d 1. Supervision of high risk pregnancy, antepartum rotine labs - Culture, beta strep (group b only) - GC/Chlamydia probe amp ([place])not at [facility]  Term labor symptoms and general obstetric precautions including but not limited to vaginal bleeding, contractions, leaking of fluid and fetal movement were reviewed in detail with the patient.  Please refer to After Visit Summary for other counseling recommendations.   No follow-ups on file.  Future Appointments  Date Time Provider [place]  12/06/2018  9:35 AM [place] Mode, MD [facility]    Emeterio Reeve, MD

## 2018-11-23 NOTE — Patient Instructions (Signed)

## 2018-11-24 LAB — GC/CHLAMYDIA PROBE AMP (~~LOC~~) NOT AT ARMC
Chlamydia: NEGATIVE
Comment: NEGATIVE
Comment: NORMAL
Neisseria Gonorrhea: NEGATIVE

## 2018-11-27 LAB — CULTURE, BETA STREP (GROUP B ONLY): Strep Gp B Culture: NEGATIVE

## 2018-12-06 ENCOUNTER — Other Ambulatory Visit: Payer: Self-pay

## 2018-12-06 ENCOUNTER — Inpatient Hospital Stay (HOSPITAL_COMMUNITY)
Admission: AD | Admit: 2018-12-06 | Discharge: 2018-12-08 | DRG: 807 | Disposition: A | Discharge status: Home / Self Care | Payer: Medicaid Other | Attending: Obstetrics and Gynecology | Admitting: Obstetrics and Gynecology

## 2018-12-06 ENCOUNTER — Encounter (HOSPITAL_COMMUNITY): Payer: Self-pay | Admitting: *Deleted

## 2018-12-06 ENCOUNTER — Ambulatory Visit (INDEPENDENT_AMBULATORY_CARE_PROVIDER_SITE_OTHER): Payer: Medicaid Other | Admitting: Obstetrics & Gynecology

## 2018-12-06 VITALS — BP 124/82 | HR 82 | Wt 145.8 lb

## 2018-12-06 DIAGNOSIS — O0990 Supervision of high risk pregnancy, unspecified, unspecified trimester: Secondary | ICD-10-CM

## 2018-12-06 DIAGNOSIS — Z20828 Contact with and (suspected) exposure to other viral communicable diseases: Secondary | ICD-10-CM | POA: Diagnosis present

## 2018-12-06 DIAGNOSIS — Z3A38 38 weeks gestation of pregnancy: Secondary | ICD-10-CM

## 2018-12-06 DIAGNOSIS — O4292 Full-term premature rupture of membranes, unspecified as to length of time between rupture and onset of labor: Secondary | ICD-10-CM | POA: Diagnosis present

## 2018-12-06 DIAGNOSIS — Z30017 Encounter for initial prescription of implantable subdermal contraceptive: Secondary | ICD-10-CM

## 2018-12-06 DIAGNOSIS — Z87898 Personal history of other specified conditions: Secondary | ICD-10-CM

## 2018-12-06 DIAGNOSIS — O322XX0 Maternal care for transverse and oblique lie, not applicable or unspecified: Secondary | ICD-10-CM | POA: Diagnosis present

## 2018-12-06 DIAGNOSIS — R87612 Low grade squamous intraepithelial lesion on cytologic smear of cervix (LGSIL): Secondary | ICD-10-CM

## 2018-12-06 DIAGNOSIS — O0993 Supervision of high risk pregnancy, unspecified, third trimester: Secondary | ICD-10-CM

## 2018-12-06 DIAGNOSIS — O4202 Full-term premature rupture of membranes, onset of labor within 24 hours of rupture: Secondary | ICD-10-CM | POA: Diagnosis not present

## 2018-12-06 LAB — COMPREHENSIVE METABOLIC PANEL
ALT: 14 U/L (ref 0–44)
AST: 20 U/L (ref 15–41)
Albumin: 2.8 g/dL — ABNORMAL LOW (ref 3.5–5.0)
Alkaline Phosphatase: 167 U/L — ABNORMAL HIGH (ref 38–126)
Anion gap: 8 (ref 5–15)
BUN: 9 mg/dL (ref 6–20)
CO2: 18 mmol/L — ABNORMAL LOW (ref 22–32)
Calcium: 8.6 mg/dL — ABNORMAL LOW (ref 8.9–10.3)
Chloride: 110 mmol/L (ref 98–111)
Creatinine, Ser: 0.72 mg/dL (ref 0.44–1.00)
GFR calc Af Amer: >60 mL/min (ref >60)
GFR calc non Af Amer: >60 mL/min (ref >60)
Glucose, Bld: 106 mg/dL — ABNORMAL HIGH (ref 70–99)
Potassium: 3.8 mmol/L (ref 3.5–5.1)
Sodium: 136 mmol/L (ref 135–145)
Total Bilirubin: 0.4 mg/dL (ref 0.3–1.2)
Total Protein: 6.9 g/dL (ref 6.5–8.1)

## 2018-12-06 LAB — CBC
HCT: 37 % (ref 36.0–46.0)
Hemoglobin: 12.5 g/dL (ref 12.0–15.0)
MCH: 31.3 pg (ref 26.0–34.0)
MCHC: 33.8 g/dL (ref 30.0–36.0)
MCV: 92.5 fL (ref 80.0–100.0)
Platelets: 207 10*3/uL (ref 150–400)
RBC: 4 MIL/uL (ref 3.87–5.11)
RDW: 13.3 % (ref 11.5–15.5)
WBC: 7.7 10*3/uL (ref 4.0–10.5)
nRBC: 0 % (ref 0.0–0.2)

## 2018-12-06 LAB — ABO/RH: ABO/RH(D): O POS

## 2018-12-06 LAB — PROTEIN / CREATININE RATIO, URINE
Creatinine, Urine: 105.54 mg/dL
Protein Creatinine Ratio: 0.11 mg/mg{Cre} (ref 0.00–0.15)
Total Protein, Urine: 12 mg/dL

## 2018-12-06 LAB — TYPE AND SCREEN
ABO/RH(D): O POS
Antibody Screen: NEGATIVE

## 2018-12-06 LAB — POCT FERN TEST: POCT Fern Test: POSITIVE

## 2018-12-06 MED ORDER — FENTANYL CITRATE (PF) 100 MCG/2ML IJ SOLN: 50.0000 ug | INTRAMUSCULAR | Status: DC | PRN

## 2018-12-06 MED ORDER — LIDOCAINE HCL (PF) 1 % IJ SOLN
30.0000 mL | INTRAMUSCULAR | Status: AC | PRN
  Administered 2018-12-06: 30 mL via SUBCUTANEOUS
  Filled 2018-12-06: qty 30

## 2018-12-06 MED ORDER — TERBUTALINE SULFATE 1 MG/ML IJ SOLN: 0.2500 mg | Freq: Once | INTRAMUSCULAR | Status: DC | PRN

## 2018-12-06 MED ORDER — OXYTOCIN BOLUS FROM INFUSION
500.0000 mL | Freq: Once | INTRAVENOUS | Status: AC
  Administered 2018-12-06: 500 mL via INTRAVENOUS

## 2018-12-06 MED ORDER — LACTATED RINGERS IV SOLN
INTRAVENOUS | Status: DC
  Administered 2018-12-06: 22:00:00 via INTRAVENOUS

## 2018-12-06 MED ORDER — OXYTOCIN 40 UNITS IN NORMAL SALINE INFUSION - SIMPLE MED
2.5000 [IU]/h | INTRAVENOUS | Status: DC
  Filled 2018-12-06: qty 1000

## 2018-12-06 MED ORDER — MISOPROSTOL 50MCG HALF TABLET
50.0000 ug | ORAL_TABLET | ORAL | Status: DC | PRN
  Administered 2018-12-06: 22:00:00 50 ug via ORAL
  Filled 2018-12-06: qty 1

## 2018-12-06 MED ORDER — LACTATED RINGERS IV SOLN
500.0000 mL | INTRAVENOUS | Status: DC | PRN
  Administered 2018-12-06: 23:00:00 500 mL via INTRAVENOUS

## 2018-12-06 MED ORDER — SOD CITRATE-CITRIC ACID 500-334 MG/5ML PO SOLN: 30.0000 mL | ORAL | Status: DC | PRN

## 2018-12-06 MED ORDER — OXYTOCIN 40 UNITS IN NORMAL SALINE INFUSION - SIMPLE MED: 1.0000 m[IU]/min | INTRAVENOUS | Status: DC

## 2018-12-06 MED ORDER — ACETAMINOPHEN 325 MG PO TABS: 650.0000 mg | ORAL_TABLET | ORAL | Status: DC | PRN

## 2018-12-06 MED ORDER — ONDANSETRON HCL 4 MG/2ML IJ SOLN: 4.0000 mg | Freq: Four times a day (QID) | INTRAMUSCULAR | Status: DC | PRN

## 2018-12-06 NOTE — Progress Notes (Signed)
   PRENATAL VISIT NOTE  Subjective:  Victoria Fitzpatrick is a 28 y.o. G3P2002 at [redacted]w[redacted]d being seen today for ongoing prenatal care.  She is currently monitored for the following issues for this high-risk pregnancy and has Supervision of high risk pregnancy, antepartum; History of low birth weight; and Low grade squamous intraepithelial lesion (LGSIL) on cervical Pap smear on their problem list.  Patient reports occasional contractions.  Contractions: Irregular. Vag. Bleeding: None.  Movement: Present. Denies leaking of fluid.   The following portions of the patient's history were reviewed and updated as appropriate: allergies, current medications, past family history, past medical history, past social history, past surgical history and problem list.   Objective:   Vitals:   12/06/18 0950  BP: 124/82  Pulse: 82  Weight: 145 lb 12.8 oz (66.1 kg)    Fetal Status: Fetal Heart Rate (bpm): 135   Movement: Present     General:  Alert, oriented and cooperative. Patient is in no acute distress.  Skin: Skin is warm and dry. No rash noted.   Cardiovascular: Normal heart rate noted  Respiratory: Normal respiratory effort, no problems with respiration noted  Abdomen: Soft, gravid, appropriate for gestational age.  Pain/Pressure: Present     Pelvic: Cervical exam performed        Extremities: Normal range of motion.  Edema: None  Mental Status: Normal mood and affect. Normal behavior. Normal judgment and thought content.   Assessment and Plan:  Pregnancy: G3P2002 at [redacted]w[redacted]d 1. Supervision of high risk pregnancy, antepartum Nl growth and AF  2. History of low birth weight   Term labor symptoms and general obstetric precautions including but not limited to vaginal bleeding, contractions, leaking of fluid and fetal movement were reviewed in detail with the patient. Please refer to After Visit Summary for other counseling recommendations.   Return in about 1 week (around 12/13/2018).  Future Appointments   Date Time Provider [place]  12/13/2018  3:15 PM [place], CNM [facility]    Emeterio Reeve, MD

## 2018-12-06 NOTE — Patient Instructions (Signed)

## 2018-12-06 NOTE — H&P (Addendum)
LABOR AND DELIVERY ADMISSION HISTORY AND PHYSICAL NOTE  Victoria Fitzpatrick is a 28 y.o. female G32P2002 with IUP at 25w5dby LMP c/w 1st trimester UKore[place]resenting for SOL, PROM.  She reports positive fetal movement. She denies vaginal bleeding. Had SROM ~2030 today and is feeling contractions.  Est FW at 312w2das 5lbs 7oz.   Patient had elevated blood pressure on admission to 131/93 but have leveled spontaneously to normal limits.  Prenatal History/Complications: [facility]:  - h/o low birth weight.   Past Medical History: Past Medical History:  Diagnosis Date  . Medical history non-contributory     Past Surgical History: Past Surgical History:  Procedure Laterality Date  . NO PAST SURGERIES      Obstetrical History: OB History    Gravida  3   Para  2   Term  2   Preterm      AB      Living  2     SAB      TAB      Ectopic      Multiple  0   Live Births  2           Social History: Social History   Socioeconomic History  . Marital status: Married    Spouse name: DuLatrelle Dodrill. Number of children: 2  . Years of education: Not on file  . Highest education level: Not on file  Occupational History  . Not on file  Social Needs  . Financial resource strain: Not hard at all  . Food insecurity    Worry: Never true    Inability: Never true  . Transportation needs    Medical: No    Non-medical: No  Tobacco Use  . Smoking status: Never Smoker  . Smokeless tobacco: Never Used  Substance and Sexual Activity  . Alcohol use: No  . Drug use: No  . Sexual activity: Yes    Birth control/protection: None  Lifestyle  . Physical activity    Days per week: Not on file    Minutes per session: Not on file  . Stress: Not on file  Relationships  . Social coHerbalistn phone: Not on file    Gets together: Not on file    Attends religious service: Not on file    Active member of club or organization: Not on file    Attends  meetings of clubs or organizations: Not on file    Relationship status: Not on file  Other Topics Concern  . Not on file  Social History Narrative  . Not on file    Family History: No family history on file.  Allergies: No Known Allergies  Medications Prior to Admission  Medication Sig Dispense Refill Last Dose  . Prenatal Vit-Fe Fumarate-FA (PRENATAL VITAMIN) 27-0.8 MG TABS Take 1 tablet by mouth daily. 30 tablet 9 12/06/2018 at Unknown time  . Blood Pressure Monitoring (BLOOD PRESSURE KIT) DEVI 1 Device by Does not apply route once a week. ICD 10: Z34.90 1 Device 0      Review of Systems  All systems reviewed and negative except as stated in HPI  Physical Exam Blood pressure 125/89, pulse 79, temperature 99.3 F (37.4 C), temperature source Oral, resp. rate 17, height '4\' 10"'$  (1.473 m), weight 65.8 kg, last menstrual period 03/10/2018, unknown if currently breastfeeding. General appearance: alert, oriented, NAD Lungs: normal respiratory effort Heart: regular rate Abdomen: soft, non-tender; gravid, FH appropriate for  GA Extremities: No calf swelling or tenderness Presentation: cephalic Fetal monitoring: cat I Uterine activity: mild Dilation: 4 Effacement (%): Thick Station: -2 Exam by:: Dr. Ermalene Searing  Prenatal labs: ABO, Rh: --/--/O POS (11/25 2114) Antibody: NEG (11/25 2114) Rubella: Immune (05/15 0000) RPR: Non Reactive (09/16 1011)  HBsAg: Negative (09/16 1011)  HIV: Non Reactive (09/16 1011)  GC/Chlamydia: neg/neg GBS: Negative/-- (11/12 1207)  2-hr GTT: normal Genetic screening:  Wnl, no AFP Anatomy US: wnl  Prenatal Transfer Tool  Maternal Diabetes: No Genetic Screening: Normal Maternal Ultrasounds/Referrals: Normal Fetal Ultrasounds or other Referrals:  None Maternal Substance Abuse:  No Significant Maternal Medications:  None Significant Maternal Lab Results: Group B Strep negative  Results for orders placed or performed during the hospital  encounter of 12/06/18 (from the past 24 hour(s))  POCT fern test   Collection Time: 12/06/18  8:57 PM  Result Value Ref Range   POCT Fern Test Positive = ruptured amniotic membanes   CBC   Collection Time: 12/06/18  9:14 PM  Result Value Ref Range   WBC 7.7 4.0 - 10.5 K/uL   RBC 4.00 3.87 - 5.11 MIL/uL   Hemoglobin 12.5 12.0 - 15.0 g/dL   HCT 37.0 36.0 - 46.0 %   MCV 92.5 80.0 - 100.0 fL   MCH 31.3 26.0 - 34.0 pg   MCHC 33.8 30.0 - 36.0 g/dL   RDW 13.3 11.5 - 15.5 %   Platelets 207 150 - 400 K/uL   nRBC 0.0 0.0 - 0.2 %  Protein / creatinine ratio, urine   Collection Time: 12/06/18  9:14 PM  Result Value Ref Range   Creatinine, Urine 105.54 mg/dL   Total Protein, Urine 12 mg/dL   Protein Creatinine Ratio 0.11 0.00 - 0.15 mg/mg[Cre]  Comprehensive metabolic panel   Collection Time: 12/06/18  9:14 PM  Result Value Ref Range   Sodium 136 135 - 145 mmol/L   Potassium 3.8 3.5 - 5.1 mmol/L   Chloride 110 98 - 111 mmol/L   CO2 18 (L) 22 - 32 mmol/L   Glucose, Bld 106 (H) 70 - 99 mg/dL   BUN 9 6 - 20 mg/dL   Creatinine, Ser 0.72 0.44 - 1.00 mg/dL   Calcium 8.6 (L) 8.9 - 10.3 mg/dL   Total Protein 6.9 6.5 - 8.1 g/dL   Albumin 2.8 (L) 3.5 - 5.0 g/dL   AST 20 15 - 41 U/L   ALT 14 0 - 44 U/L   Alkaline Phosphatase 167 (H) 38 - 126 U/L   Total Bilirubin 0.4 0.3 - 1.2 mg/dL   GFR calc non Af Amer >60 >60 mL/min   GFR calc Af Amer >60 >60 mL/min   Anion gap 8 5 - 15  Type and screen [place]   Collection Time: 12/06/18  9:14 PM  Result Value Ref Range   ABO/RH(D) O POS    Antibody Screen NEG    Sample Expiration      12/09/2018,2359 Performed at [facility], [address], [place], [place] [address]     Patient Active Problem List   Diagnosis Date Noted  . Normal labor 12/06/2018  . Supervision of high risk pregnancy, antepartum 06/12/2018  . History of low birth weight 06/12/2018  . Low grade squamous intraepithelial lesion (LGSIL) on cervical  Pap smear 06/12/2018    Assessment: Victoria Fitzpatrick is a 28 y.o. G3P2002 at 36w5dhere for SOL, PROM. Continue to monitor blood pressures.   #Labor:  cytotec at this time.  #Pain: No meds at this time #FWB: Cat I #ID:  GBS neg #MOF: both #MOC: Nexplanon #Circ:  NA- girl  Doristine Mango, DO PGY-2 FM 12/06/2018, 10:21 PM    OB FELLOW ATTESTATION  I have seen and examined this patient and agree with above documentation in the resident's note except as noted below.  28 y/o G3P2 at 56w5dhere for SROM at ~2030. Prior hx of NSVD x2 with largest prior infant 2670g. Most recent UKorea11/3 w EFW 2477g, 24%. In MAU had single elevated mild range BP but normotensive since, PIH labs unremarkable, ctm. GBS neg, prenatal labs unremarkable. Planning on nexplanon for PP contraception. Patient with thick cervix on arrival to floor, ripen w miso. Anticipate NSVD.   MAugustin Coupe MD/MP[facility] OB Fellow  12/06/2018, 10:27 PM

## 2018-12-06 NOTE — MAU Note (Signed)
Pt reports water broke about 20 mins ago, brownish colored. Contractions earlier today, but none now. Reports good fetal movement.

## 2018-12-07 ENCOUNTER — Encounter (HOSPITAL_COMMUNITY): Payer: Self-pay

## 2018-12-07 DIAGNOSIS — O4202 Full-term premature rupture of membranes, onset of labor within 24 hours of rupture: Secondary | ICD-10-CM

## 2018-12-07 DIAGNOSIS — Z30017 Encounter for initial prescription of implantable subdermal contraceptive: Secondary | ICD-10-CM

## 2018-12-07 DIAGNOSIS — Z3A38 38 weeks gestation of pregnancy: Secondary | ICD-10-CM

## 2018-12-07 LAB — SARS CORONAVIRUS 2 (TAT 6-24 HRS): SARS Coronavirus 2: NEGATIVE

## 2018-12-07 LAB — RPR: RPR Ser Ql: NONREACTIVE

## 2018-12-07 MED ORDER — IBUPROFEN 600 MG PO TABS
600.0000 mg | ORAL_TABLET | Freq: Four times a day (QID) | ORAL | Status: DC
  Administered 2018-12-07 – 2018-12-08 (×5): 600 mg via ORAL
  Filled 2018-12-07 (×5): qty 1

## 2018-12-07 MED ORDER — SENNOSIDES-DOCUSATE SODIUM 8.6-50 MG PO TABS
2.0000 | ORAL_TABLET | ORAL | Status: DC
  Administered 2018-12-07: 23:00:00 2 via ORAL
  Filled 2018-12-07: qty 2

## 2018-12-07 MED ORDER — ACETAMINOPHEN 325 MG PO TABS: 650.0000 mg | ORAL_TABLET | ORAL | Status: DC | PRN

## 2018-12-07 MED ORDER — PRENATAL MULTIVITAMIN CH
1.0000 | ORAL_TABLET | Freq: Every day | ORAL | Status: DC
  Administered 2018-12-07: 11:00:00 1 via ORAL
  Filled 2018-12-07: qty 1

## 2018-12-07 MED ORDER — LIDOCAINE HCL 1 % IJ SOLN
INTRAMUSCULAR | Status: AC
  Administered 2018-12-07: 20 mL
  Filled 2018-12-07: qty 20

## 2018-12-07 MED ORDER — DIBUCAINE (PERIANAL) 1 % EX OINT: 1.0000 "application " | TOPICAL_OINTMENT | CUTANEOUS | Status: DC | PRN

## 2018-12-07 MED ORDER — DIPHENHYDRAMINE HCL 25 MG PO CAPS: 25.0000 mg | ORAL_CAPSULE | Freq: Four times a day (QID) | ORAL | Status: DC | PRN

## 2018-12-07 MED ORDER — ZOLPIDEM TARTRATE 5 MG PO TABS: 5.0000 mg | ORAL_TABLET | Freq: Every evening | ORAL | Status: DC | PRN

## 2018-12-07 MED ORDER — LIDOCAINE HCL 1 % IJ SOLN: 0.0000 mL | Freq: Once | INTRAMUSCULAR | Status: DC | PRN

## 2018-12-07 MED ORDER — BENZOCAINE-MENTHOL 20-0.5 % EX AERO
1.0000 "application " | INHALATION_SPRAY | CUTANEOUS | Status: DC | PRN
  Administered 2018-12-07: 1 via TOPICAL
  Filled 2018-12-07: qty 56

## 2018-12-07 MED ORDER — ONDANSETRON HCL 4 MG PO TABS: 4.0000 mg | ORAL_TABLET | ORAL | Status: DC | PRN

## 2018-12-07 MED ORDER — TETANUS-DIPHTH-ACELL PERTUSSIS 5-2.5-18.5 LF-MCG/0.5 IM SUSP: 0.5000 mL | Freq: Once | INTRAMUSCULAR | Status: DC

## 2018-12-07 MED ORDER — SIMETHICONE 80 MG PO CHEW: 80.0000 mg | CHEWABLE_TABLET | ORAL | Status: DC | PRN

## 2018-12-07 MED ORDER — ONDANSETRON HCL 4 MG/2ML IJ SOLN: 4.0000 mg | INTRAMUSCULAR | Status: DC | PRN

## 2018-12-07 MED ORDER — COCONUT OIL OIL: 1.0000 "application " | TOPICAL_OIL | Status: DC | PRN

## 2018-12-07 MED ORDER — ETONOGESTREL 68 MG ~~LOC~~ IMPL
68.0000 mg | DRUG_IMPLANT | Freq: Once | SUBCUTANEOUS | Status: AC
  Administered 2018-12-07: 68 mg via SUBCUTANEOUS
  Filled 2018-12-07: qty 1

## 2018-12-07 MED ORDER — WITCH HAZEL-GLYCERIN EX PADS: 1.0000 "application " | MEDICATED_PAD | CUTANEOUS | Status: DC | PRN

## 2018-12-07 NOTE — Discharge Summary (Signed)
Postpartum Discharge Summary     Patient Name: Victoria Fitzpatrick DOB: November 21, 1990 MRN: [id number]  Date of admission: 12/06/2018 Delivering Provider: Clarnce Flock   Date of discharge: 12/08/2018  Admitting diagnosis: Water Broke  Intrauterine pregnancy: [redacted]w[redacted]d    Secondary diagnosis:  Active Problems:   Supervision of high risk pregnancy, antepartum   Low grade squamous intraepithelial lesion (LGSIL) on cervical Pap smear   Normal labor  Additional problems: None     Discharge diagnosis: Term Pregnancy Delivered                                                                                                Post partum procedures:Nexplanon insertion  Augmentation: None  Complications: None  Hospital course:  Onset of Labor With Vaginal Delivery     28y.o. yo [id number] 380w6das admitted in Latent Labor on 12/06/2018. Patient had an uncomplicated labor course as follows: admitted at 3cm with thick cervix, given one misoprostol and very quickly progressed to fully dilated with rapid NSVD. Membrane Rupture Time/Date: 8:30 PM ,12/06/2018   Intrapartum Procedures: Episiotomy: None [1]                                         Lacerations:  1st degree [2]  Patient had a delivery of a Viable infant. 12/06/2018  Information for the patient's newborn:  Ivanov, Girl Jacquelina [[id number]]Delivery Method: Vaginal, Spontaneous(Filed from Delivery Summary)     Pateint had an uncomplicated postpartum course.    During initial MAU triage had single elevated BP of 131/93. PIH labs were normal, and subsequent BP's were all normal.   She is ambulating, tolerating a regular diet, passing flatus, and urinating well. Patient is discharged home in stable condition on 12/08/18.  Delivery time: 11:33 PM    Magnesium Sulfate received: No BMZ received: No Rhophylac:N/A MMR:N/A Transfusion:No  Physical exam  Vitals:   12/07/18 0604 12/07/18 1040 12/07/18 1548 12/07/18 2300  BP: 112/77 107/74  121/79 108/81  Pulse: 80 75 69 70  Resp: '18 18 18   '$ Temp: 98.1 F (36.7 C) 98.1 F (36.7 C) 98 F (36.7 C) 98.5 F (36.9 C)  TempSrc: Oral Oral Oral Oral  SpO2: 99% 99%  98%  Weight:      Height:       General: alert, cooperative and no distress Lochia: appropriate Uterine Fundus: firm Incision: N/A DVT Evaluation: No evidence of DVT seen on physical exam. No significant calf/ankle edema. Labs: Lab Results  Component Value Date   WBC 7.7 12/06/2018   HGB 12.5 12/06/2018   HCT 37.0 12/06/2018   MCV 92.5 12/06/2018   PLT 207 12/06/2018   CMP Latest Ref Rng & Units 12/06/2018  Glucose 70 - 99 mg/dL 106(H)  BUN 6 - 20 mg/dL 9  Creatinine 0.44 - 1.00 mg/dL 0.72  Sodium 135 - 145 mmol/L 136  Potassium 3.5 - 5.1 mmol/L 3.8  Chloride 98 - 111 mmol/L 110  CO2 22 - 32 mmol/L  18(L)  Calcium 8.9 - 10.3 mg/dL 8.6(L)  Total Protein 6.5 - 8.1 g/dL 6.9  Total Bilirubin 0.3 - 1.2 mg/dL 0.4  Alkaline Phos 38 - 126 U/L 167(H)  AST 15 - 41 U/L 20  ALT 0 - 44 U/L 14    Discharge instruction: per After Visit Summary and "Baby and Me Booklet".  After visit meds:  Allergies as of 12/08/2018   No Known Allergies     Medication List    TAKE these medications   acetaminophen 325 MG tablet Commonly known as: Tylenol Take 2 tablets (650 mg total) by mouth every 4 (four) hours as needed (for pain scale < 4).   Blood Pressure Kit Devi 1 Device by Does not apply route once a week. ICD 10: Z34.90   ibuprofen 600 MG tablet Commonly known as: ADVIL Take 1 tablet (600 mg total) by mouth every 6 (six) hours.   polyethylene glycol powder 17 GM/SCOOP powder Commonly known as: GLYCOLAX/MIRALAX Take 255 g by mouth once for 1 dose.   Prenatal Vitamin 27-0.8 MG Tabs Take 1 tablet by mouth daily.       Diet: routine diet  Activity: Advance as tolerated. Pelvic rest for 6 weeks.   Outpatient follow up:6 weeks Follow up Appt: Future Appointments  Date Time Provider [place]  12/13/2018  3:15 PM Tresea Mall, CNM [facility]   Follow up Visit:   Please schedule this patient for Postpartum visit in: 6 weeks with the following provider: Any provider For C/S patients schedule nurse incision check in weeks 2 weeks: no Low risk pregnancy complicated by: n/a Delivery mode:  SVD Anticipated Birth Control:  Nexplanon, placed prior to discharge PP Procedures needed: none  Schedule Integrated [place] visit: no    Newborn Data: Live born female  Birth Weight: 2801 grams  APGAR: 50, 9  Newborn Delivery   Birth date/time: 12/06/2018 23:33:00 Delivery type: Vaginal, Spontaneous      Baby Feeding: Bottle and Breast Disposition:home with mother   12/08/2018 Clarnce Flock, MD

## 2018-12-07 NOTE — Lactation Note (Signed)
This note was copied from a baby's chart. Lactation Consultation Note  Patient Name: Victoria Fitzpatrick Today's Date: 12/07/2018 Reason for consult: Initial assessment;Early term 37-38.6wks P3.  Mom breastfed and formula fed her last baby for 1 month.  She chooses to do the same with newborn.  Infant is 33 hours old.  She has been to breast twice and also received formula.  Stressed importance of putting baby to breast prior to giving formula.  Encouraged to call for assist prn.  Breastfeeding consultation services information given and reviewed.  Maternal Data    Feeding    LATCH Score                   Interventions    Lactation Tools Discussed/Used     Consult Status Consult Status: Follow-up Date: 12/08/18 Follow-up type: In-patient    Ave Filter 12/07/2018, 9:05 AM

## 2018-12-07 NOTE — Procedures (Signed)
   PROCEDURE NOTE  Victoria Fitzpatrick is a 28 y.o. G3P3003 now PPD#1 who desires Nexplanon insertion.   Nexplanon Insertion Procedure Patient identified, informed consent performed, consent signed.   Patient does understand that irregular bleeding is a very common side effect of this medication. Appropriate time out taken.  Patient's left arm was prepped and draped in the usual sterile fashion. The ruler used to measure and mark insertion area.  Patient was prepped with alcohol swab and then injected with 3 ml of 1% lidocaine.  She was prepped with betadine, Nexplanon removed from packaging,  Device confirmed in needle, then inserted full length of needle and withdrawn per handbook instructions. Nexplanon was able to palpated in the patient's arm; patient palpated the insert herself. There was minimal blood loss.  Patient insertion site covered with guaze and a pressure bandage to reduce any bruising.  The patient tolerated the procedure well and was given post procedure instructions.   Barrington Ellison, MD [facility] Family Medicine Fellow, [facility] for [employer], [place]

## 2018-12-07 NOTE — Progress Notes (Signed)
POSTPARTUM PROGRESS NOTE  Post Partum Day 1  Subjective:  Victoria Fitzpatrick is a 28 y.o. M0H6808 s/p NSVD at [redacted]w[redacted]d.  She reports she is doing well. No acute events overnight. She denies any problems with ambulating, voiding or po intake. Denies nausea or vomiting.  Pain is well controlled.  Lochia is appropriate.  Objective: Blood pressure 112/77, pulse 80, temperature 98.1 F (36.7 C), temperature source Oral, resp. rate 18, height 4\' 10"  (1.473 m), weight 65.8 kg, last menstrual period 03/10/2018, SpO2 99 %, unknown if currently breastfeeding.  Physical Exam:  General: alert, cooperative and no distress Chest: no respiratory distress Heart:regular rate, distal pulses intact Abdomen: soft, nontender,  Uterine Fundus: firm, appropriately tender DVT Evaluation: No calf swelling or tenderness Extremities: no LE edema Skin: warm, dry  Recent Labs    12/06/18 2114  HGB 12.5  HCT 37.0    Assessment/Plan: Victoria Fitzpatrick is a 28 y.o. G3P3003 s/p NSVD at [redacted]w[redacted]d   PPD#1 - Doing well  Routine postpartum care  Elevated BP x1: normotensive since, PIH labs unremarkable, ctm  Contraception: nexplanon later today or tomorrow Feeding: breast and bottle  Dispo: Plan for discharge PPD#2 (late delivery).   LOS: 1 day   Augustin Coupe, MD/[facility] OB Fellow  12/07/2018, 6:57 AM

## 2018-12-07 NOTE — Progress Notes (Signed)
Pt received Nexplanon as requested with consent signed. TIME out was done by RN and and MD with verification of pt's name, birthday and procedure at 64. Nexplanon inserted in the left arm and pt tolerated procedure well.

## 2018-12-07 NOTE — Plan of Care (Signed)

## 2018-12-07 NOTE — Progress Notes (Signed)
RN made patient aware on admission to notify when ready to get up to bathroom with steady. RN was not notified by patient so after a few hours RN went into room to assist patient to the bathroom with steady. Patient stated to RN that she already went to the bathroom twice with help of husband. Patient stable.

## 2018-12-08 MED ORDER — ACETAMINOPHEN 325 MG PO TABS: 650.0000 mg | ORAL_TABLET | ORAL | 0 refills | Status: AC | PRN

## 2018-12-08 MED ORDER — IBUPROFEN 600 MG PO TABS: 600.0000 mg | ORAL_TABLET | Freq: Four times a day (QID) | ORAL | 0 refills | Status: DC

## 2018-12-08 MED ORDER — POLYETHYLENE GLYCOL 3350 17 GM/SCOOP PO POWD: 1.0000 | Freq: Once | ORAL | 0 refills | Status: AC

## 2018-12-08 NOTE — Discharge Instructions (Signed)

## 2018-12-11 ENCOUNTER — Encounter (HOSPITAL_COMMUNITY): Payer: Self-pay | Admitting: *Deleted

## 2018-12-13 ENCOUNTER — Encounter: Payer: Medicaid Other | Admitting: Advanced Practice Midwife

## 2019-01-11 ENCOUNTER — Other Ambulatory Visit: Payer: Self-pay

## 2019-01-11 ENCOUNTER — Encounter: Payer: Self-pay | Admitting: Obstetrics and Gynecology

## 2019-01-11 ENCOUNTER — Telehealth (INDEPENDENT_AMBULATORY_CARE_PROVIDER_SITE_OTHER): Payer: Medicaid Other | Admitting: Obstetrics and Gynecology

## 2019-01-11 DIAGNOSIS — Z1389 Encounter for screening for other disorder: Secondary | ICD-10-CM

## 2019-01-11 DIAGNOSIS — Z975 Presence of (intrauterine) contraceptive device: Secondary | ICD-10-CM | POA: Diagnosis not present

## 2019-01-11 NOTE — Progress Notes (Signed)
I connected with@ on 01/11/19 at  9:15 AM EST by: mychart and verified that I am speaking with the correct person using two identifiers.  Patient is located at home and provider is located at [facility] office.     The purpose of this virtual visit is to provide medical care while limiting exposure to the novel coronavirus. I discussed the limitations, risks, security and privacy concerns of performing an evaluation and management service by the provider and the availability of in person appointments. I also discussed with the patient that there may be a patient responsible charge related to this service. By engaging in this virtual visit, you consent to the provision of healthcare.  Additionally, you authorize for your insurance to be billed for the services provided during this visit.  The patient expressed understanding and agreed to proceed.  Post Partum Visit Note Subjective:    Ms. Victoria Fitzpatrick is a 28 y.o. G1P3003 female who presents for a postpartum visit. She is 5 weeks postpartum following a spontaneous vaginal delivery. I have fully reviewed the prenatal and intrapartum course. The delivery was at 38/6 gestational weeks. Outcome: spontaneous vaginal delivery. Anesthesia: none. Postpartum course has been normal. Baby's course has been normal. Baby is feeding by bottle - [employer] Good Start. Bleeding staining only. Bowel function is normal. Bladder function is normal. Patient is not sexually active. Contraception method is Nexplanon. Postpartum depression screening: negative.  The following portions of the patient's history were reviewed and updated as appropriate: allergies, current medications, past family history, past medical history, past social history, past surgical history and problem list.  Review of Systems Pertinent items are noted in HPI.   Objective:  There were no vitals filed for this visit. Self-Obtained       Assessment:   Normal postpartum exam. Pap smear not done at today's  visit. Last pap smear 05/2018 and results were abnormal. Next pap due 5/21.  States BP at home has been normal   Plan:   1. Contraception: Nexplanon 2. Needs PAP in 2021 d/t LSIL  3. Follow up as needed.   7 minutes of non-face-to-face time spent with the patient   Vasilia Dise, Artist Pais, NP 01/11/2019 9:34 AM

## 2019-06-26 DIAGNOSIS — H5213 Myopia, bilateral: Secondary | ICD-10-CM | POA: Diagnosis not present

## 2019-07-09 DIAGNOSIS — H5213 Myopia, bilateral: Secondary | ICD-10-CM | POA: Diagnosis not present

## 2019-07-25 DIAGNOSIS — H5213 Myopia, bilateral: Secondary | ICD-10-CM | POA: Diagnosis not present

## 2020-08-14 ENCOUNTER — Encounter (HOSPITAL_COMMUNITY): Payer: Self-pay

## 2020-08-14 ENCOUNTER — Ambulatory Visit (HOSPITAL_COMMUNITY)
Admission: EM | Admit: 2020-08-14 | Discharge: 2020-08-14 | Disposition: A | Discharge status: Home / Self Care | Payer: Medicaid Other | Attending: Family Medicine | Admitting: Family Medicine

## 2020-08-14 ENCOUNTER — Other Ambulatory Visit: Payer: Self-pay

## 2020-08-14 DIAGNOSIS — M654 Radial styloid tenosynovitis [de Quervain]: Secondary | ICD-10-CM | POA: Diagnosis not present

## 2020-08-14 MED ORDER — DICLOFENAC SODIUM 75 MG PO TBEC: 75.0000 mg | DELAYED_RELEASE_TABLET | Freq: Two times a day (BID) | ORAL | 0 refills | Status: DC

## 2020-08-14 NOTE — ED Provider Notes (Addendum)
[facility]   [id number] 08/14/20 Arrival Time: 0920  ASSESSMENT & PLAN:  1. De Quervain's tenosynovitis, bilateral    No indication for plain imaging at this time.  Begin: Meds ordered this encounter  Medications   diclofenac (VOLTAREN) 75 MG EC tablet    Sig: Take 1 tablet (75 mg total) by mouth 2 (two) times daily.    Dispense:  14 tablet    Refill:  0   Work note provided.  Orders Placed This Encounter  Procedures   Apply Thumb spica  Bilateral.  Recommend:  Follow-up Information     [facility].   Why: If worsening or failing to improve as anticipated. Contact information: [address] [place] [address] [phone]        [facility] [place].   Specialty: Urgent Care Why: As needed. Contact information: [address] [place] [address] [phone]               Reviewed expectations re: course of current medical issues. Questions answered. Outlined signs and symptoms indicating need for more acute intervention. Patient verbalized understanding. After Visit Summary given.  SUBJECTIVE: History from: patient. Victoria Fitzpatrick is a 30 y.o. female who reports intermittent bilateral wrist/thumb pain; over past 2 months. Works as Advertising account planner; massaging people's hands daily; question relation. Occasional tingling in thumbs. Currently R>L. No trauma.  Past Surgical History:  Procedure Laterality Date   NO PAST SURGERIES        OBJECTIVE:  Vitals:   08/14/20 1018  BP: 110/74  Pulse: 69  Resp: 18  Temp: 98.5 F (36.9 C)  TempSrc: Oral  SpO2: 100%    General appearance: alert; no distress HEENT: [place]; AT Neck: supple with FROM Resp: unlabored respirations Extremities: Bilateral UE: warm with well perfused appearance; pain reported over radial side of wrists, more notable with thumb and wrist movement; no erythema or inflammation; no swelling;  FROM; very tender over radial styloids CV: brisk extremity capillary refill of bilateral UE; 2+ radial pulse of bilateral UE. Skin: warm and dry; no visible rashes Neurologic: gait normal; normal sensation and strength of bilateral UE Psychological: alert and cooperative; normal mood and affect  No Known Allergies  Past Medical History:  Diagnosis Date   Medical history non-contributory    Social History   Socioeconomic History   Marital status: Married    Spouse name: Duinh   Number of children: 2   Years of education: Not on file   Highest education level: Not on file  Occupational History   Not on file  Tobacco Use   Smoking status: Never   Smokeless tobacco: Never  Vaping Use   Vaping Use: Never used  Substance and Sexual Activity   Alcohol use: No   Drug use: No   Sexual activity: Yes    Birth control/protection: None  Other Topics Concern   Not on file  Social History Narrative   Not on file   Social Determinants of Health   Financial Resource Strain: Not on file  Food Insecurity: Not on file  Transportation Needs: Not on file  Physical Activity: Not on file  Stress: Not on file  Social Connections: Not on file   History reviewed. No pertinent family history. Past Surgical History:  Procedure Laterality Date   NO PAST SURGERIES         Mardella Layman, MD 08/14/20 1044    Mardella Layman, MD  08/14/20 1045  

## 2020-08-14 NOTE — ED Triage Notes (Signed)
Pt presents with intermittent bilateral arm pain X 2 months.

## 2020-11-19 DIAGNOSIS — M25549 Pain in joints of unspecified hand: Secondary | ICD-10-CM | POA: Diagnosis not present

## 2020-11-19 DIAGNOSIS — M25542 Pain in joints of left hand: Secondary | ICD-10-CM | POA: Diagnosis not present

## 2020-11-19 DIAGNOSIS — M25562 Pain in left knee: Secondary | ICD-10-CM | POA: Diagnosis not present

## 2020-11-19 DIAGNOSIS — M25561 Pain in right knee: Secondary | ICD-10-CM | POA: Diagnosis not present

## 2020-11-19 DIAGNOSIS — M25541 Pain in joints of right hand: Secondary | ICD-10-CM | POA: Diagnosis not present

## 2021-02-05 DIAGNOSIS — M069 Rheumatoid arthritis, unspecified: Secondary | ICD-10-CM | POA: Diagnosis not present

## 2021-03-18 NOTE — Progress Notes (Signed)
Office Visit Note  Patient: Victoria Fitzpatrick             Date of Birth: August 27, 1990           MRN: [id number]             PCP: Lindaann Pascal, PA-C Referring: Lindaann Pascal, PA-C Visit Date: 03/19/2021 Occupation: Nail technician  Subjective:  New Patient (Initial Visit) (Abnormal labs, RA)   History of Present Illness: Mikiah Robideau is a 31 y.o. female here for evaluation of bilateral hands and knees joint pains since last year. This started last summer she does not recall any preceding illness or changes before problems started. She has had wrist pain years ago but no prior chronic issue in the current areas. Labs showed RF of 24, ESR of 51, and xrays of hands and knees were unremarkable. She took prednisone for 2 weeks with great improvement of symptoms but returned after finishing the medication. She is taking a daily supplement joint complex containing shellfish products no other medications. Hand and wrist and knee pain bothers her during work but worst stiffness is first thing in the mornings.  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   Patient Reports nocturnal pain.  Difficulty dressing/grooming: Reports Difficulty climbing stairs: Reports Difficulty getting out of chair: Denies Difficulty using hands for taps, buttons, cutlery, and/or writing: Denies  Review of Systems  Constitutional:  Positive for fatigue.  HENT:  Negative for mouth dryness.   Eyes:  Negative for dryness.  Respiratory:  Negative for shortness of breath.   Cardiovascular:  Negative for swelling in legs/feet.  Gastrointestinal:  Negative for constipation.  Endocrine: Negative for excessive thirst.  Genitourinary:  Negative for difficulty urinating.  Musculoskeletal:  Positive  for joint pain, joint pain, joint swelling, muscle weakness, morning stiffness and muscle tenderness.  Skin:  Negative for rash.  Allergic/Immunologic: Positive for susceptible to infections.  Neurological:  Negative for numbness.  Hematological:  Negative for bruising/bleeding tendency.  Psychiatric/Behavioral:  Negative for sleep disturbance.    PMFS History:  Patient Active Problem List   Diagnosis Date Noted   Rheumatoid factor positive 03/19/2021   Bilateral hand pain 03/19/2021   Normal labor 12/06/2018   Supervision of high risk pregnancy, antepartum 06/12/2018   History of low birth weight 06/12/2018   Low grade squamous intraepithelial lesion (LGSIL) on cervical Pap smear 06/12/2018    Past Medical History:  Diagnosis Date   Medical history non-contributory    Rheumatoid arthritis (HCC)     Family History  Problem Relation Age of Onset   Liver disease Brother    Past Surgical History:  Procedure Laterality Date   NO PAST SURGERIES     Social History   Social History Narrative   Not on file   Immunization History  Administered Date(s) Administered   Influenza,inj,Quad PF,6+ Mos 10/10/2013, 10/12/2018   Tdap 10/31/2013, 10/12/2018     Objective: Vital Signs: BP 107/73 (BP Location: Right Arm, Patient Position: Sitting, Cuff Size: Normal)   Pulse 98   Resp 14   Ht 4' 10.5" (1.486 m)   Wt 108 lb (49 kg)   BMI 22.19 kg/m    Physical Exam HENT:     Right Ear: External ear normal.  Left Ear: External ear normal.     Mouth/Throat:     Mouth: Mucous membranes are moist.     Pharynx: Oropharynx is clear.  Eyes:     Conjunctiva/sclera: Conjunctivae normal.  Cardiovascular:     Rate and Rhythm: Normal rate and regular rhythm.  Pulmonary:     Effort: Pulmonary effort is normal.     Breath sounds: Normal breath sounds.  Musculoskeletal:     Right lower leg: No edema.     Left lower leg: No edema.  Lymphadenopathy:     Cervical: No cervical adenopathy.   Skin:    General: Skin is warm and dry.     Findings: No rash.  Neurological:     General: No focal deficit present.     Mental Status: She is alert.  Psychiatric:        Mood and Affect: Mood normal.     Musculoskeletal Exam:  Neck full ROM no tenderness Shoulders full ROM no tenderness or swelling Elbows full ROM no tenderness or swelling Wrists full ROM mild tenderness to pressure on ulnar side of right wrist, no synovitis Right 5th PIP joint slight erythema overlying edges of joint, tender to pressure, full ROM, no palpable synovitis Knees full ROM no tenderness or swelling Ankles full ROM no tenderness or swelling  Limited ultrasound inspection of right wrist and right 5th PIP without visible effusion or color doppler enhancement   CDAI Exam: CDAI Score: -- Patient Global: --; Provider Global: -- Swollen: 0 ; Tender: 2  Joint Exam 03/19/2021      Right  Left  Wrist   Tender     PIP 5   Tender        Investigation: No additional findings.  Imaging: No results found.  Recent Labs: Lab Results  Component Value Date   WBC 7.7 12/06/2018   HGB 12.5 12/06/2018   PLT 207 12/06/2018   NA 136 12/06/2018   K 3.8 12/06/2018   CL 110 12/06/2018   CO2 18 (L) 12/06/2018   GLUCOSE 106 (H) 12/06/2018   BUN 9 12/06/2018   CREATININE 0.72 12/06/2018   BILITOT 0.4 12/06/2018   ALKPHOS 167 (H) 12/06/2018   AST 20 12/06/2018   ALT 14 12/06/2018   PROT 6.9 12/06/2018   ALBUMIN 2.8 (L) 12/06/2018   CALCIUM 8.6 (L) 12/06/2018   GFRAA >60 12/06/2018    Speciality Comments: No specialty comments available.  Procedures:  No procedures performed Allergies: Patient has no known allergies.   Assessment / Plan:     Visit Diagnoses: Rheumatoid factor positive - Plan: Sedimentation rate, Rheumatoid factor, naproxen sodium (ALEVE) 220 MG tablet  Low positive RF and moderately elevated sed rate without much clinical change on exam. Repeating labs rule out transient  elevation. Could just be early case of seropositive RA. Plan to reassess clinical response to oral NSAIDs in next month.  Bilateral hand pain - Plan: naproxen sodium (ALEVE) 220 MG tablet  I do not see obvious structural cause for joint pain no injuries no ultrasound changes.  Orders: Orders Placed This Encounter  Procedures   Sedimentation rate   Rheumatoid factor   Meds ordered this encounter  Medications   naproxen sodium (ALEVE) 220 MG tablet    Sig: Take 1 tablet (220 mg total) by mouth daily as needed.     Follow-Up Instructions: Return in about 6 weeks (around 04/29/2021) for New pt ?RA f/u.   Fuller Plan, MD  Note - This record  has been created using [employer].  Chart creation errors have been sought, but may not always  have been located. Such creation errors do not reflect on  the standard of medical care.

## 2021-03-19 ENCOUNTER — Ambulatory Visit: Payer: Medicaid Other | Admitting: Internal Medicine

## 2021-03-19 ENCOUNTER — Encounter: Payer: Self-pay | Admitting: Internal Medicine

## 2021-03-19 ENCOUNTER — Other Ambulatory Visit: Payer: Self-pay

## 2021-03-19 VITALS — BP 107/73 | HR 98 | Resp 14 | Ht 58.5 in | Wt 108.0 lb

## 2021-03-19 DIAGNOSIS — M79641 Pain in right hand: Secondary | ICD-10-CM | POA: Diagnosis not present

## 2021-03-19 DIAGNOSIS — R768 Other specified abnormal immunological findings in serum: Secondary | ICD-10-CM | POA: Diagnosis not present

## 2021-03-19 DIAGNOSIS — M79642 Pain in left hand: Secondary | ICD-10-CM

## 2021-03-19 MED ORDER — NAPROXEN SODIUM 220 MG PO TABS: 220.0000 mg | ORAL_TABLET | Freq: Every day | ORAL | Status: DC | PRN

## 2021-03-19 NOTE — Patient Instructions (Addendum)
I am checking blood tests for inflammation and the rheumatoid arthritis antibody level. These were abnormal last year. I don't see a lot of inflammation in your joints today. ? ?I recommend trying to take a medicine for inflammation daily for the next few weeks to see how much this helps symptoms. Naproxen 220 mg daily as needed take this medicine with food to protect against side effects. ?

## 2021-03-20 ENCOUNTER — Other Ambulatory Visit: Payer: Self-pay | Admitting: *Deleted

## 2021-03-20 DIAGNOSIS — M79641 Pain in right hand: Secondary | ICD-10-CM

## 2021-03-20 DIAGNOSIS — R768 Other specified abnormal immunological findings in serum: Secondary | ICD-10-CM

## 2021-03-20 LAB — SEDIMENTATION RATE: Sed Rate: 74 mm/h — ABNORMAL HIGH (ref 0–20)

## 2021-03-20 LAB — RHEUMATOID FACTOR: Rheumatoid fact SerPl-aCnc: 60 IU/mL — ABNORMAL HIGH (ref <14)

## 2021-03-20 NOTE — Progress Notes (Signed)
Labs are higher positive for RA than her results last year. I still want her to try and see how much benefit does taking daily naproxen help in the next few weeks. ?We should follow up next month to take another look and discuss plan.

## 2021-04-06 DIAGNOSIS — Z3046 Encounter for surveillance of implantable subdermal contraceptive: Secondary | ICD-10-CM | POA: Diagnosis not present

## 2021-04-18 NOTE — Progress Notes (Deleted)
? ?Office Visit Note ? ?Patient: Victoria Fitzpatrick             ?Date of Birth: 08/13/1990           ?MRN: [id number]             ?PCP: Shanon Rosser, PA-C ?Referring: No ref. provider found ?Visit Date: 04/20/2021 ? ? ?Subjective:  ?No chief complaint on file. ? ? ?History of Present Illness: Victoria Fitzpatrick is a 31 y.o. female here for follow up with bilateral hand and knee pain positive RF and ESR no frank synovitis initial trial of naproxen since last month.***  ? ?Previous HPI ?03/19/21 ?[name] is a 31 y.o. female here for evaluation of bilateral hands and knees joint pains since last year. This started last summer she does not recall any preceding illness or changes before problems started. She has had wrist pain years ago but no prior chronic issue in the current areas. Labs showed RF of 24, ESR of 51, and xrays of hands and knees were unremarkable. She took prednisone for 2 weeks with great improvement of symptoms but returned after finishing the medication. She is taking a daily supplement joint complex containing shellfish products no other medications. Hand and wrist and knee pain bothers her during work but worst stiffness is first thing in the mornings. ?  ?Labs reviewed ?RF 24 ?CCP neg ?ANA neg ?ESR 51 ?CRP 1.3 ?  ?11/19/20 Xray bilateral hands ?Negative bilateral hands ?  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 ? ?No Rheumatology ROS completed.  ? ?PMFS History:  ?Patient Active Problem List  ? Diagnosis Date Noted  ? Rheumatoid factor positive 03/19/2021  ? Bilateral hand pain 03/19/2021  ? Normal labor 12/06/2018  ? Supervision of high risk pregnancy, antepartum 06/12/2018  ? History of low birth weight 06/12/2018  ? Low grade squamous intraepithelial lesion (LGSIL) on cervical Pap smear 06/12/2018  ?  ?Past Medical History:  ?Diagnosis Date  ? Medical history non-contributory   ? Rheumatoid arthritis ([place])   ?  ?Family History  ?Problem Relation Age of Onset  ?  Liver disease Brother   ? ?Past Surgical History:  ?Procedure Laterality Date  ? NO PAST SURGERIES    ? ?Social History  ? ?Social History Narrative  ? Not on file  ? ?Immunization History  ?Administered Date(s) Administered  ? Influenza,inj,Quad PF,6+ Mos 10/10/2013, 10/12/2018  ? Tdap 10/31/2013, 10/12/2018  ?  ? ?Objective: ?Vital Signs: There were no vitals taken for this visit.  ? ?Physical Exam  ? ?Musculoskeletal Exam: *** ? ?CDAI Exam: ?CDAI Score: -- ?Patient Global: --; Provider Global: -- ?Swollen: --; Tender: -- ?Joint Exam 04/20/2021  ? ?No joint exam has been documented for this visit  ? ?There is currently no information documented on the homunculus. Go to the Rheumatology activity and complete the homunculus joint exam. ? ?Investigation: ?No additional findings. ? ?Imaging: ?No results found. ? ?Recent Labs: ?Lab Results  ?Component Value Date  ? WBC 7.7 12/06/2018  ? HGB 12.5 12/06/2018  ? PLT 207 12/06/2018  ? NA 136 12/06/2018  ? K 3.8 12/06/2018  ? CL 110 12/06/2018  ? CO2 18 (L) 12/06/2018  ? GLUCOSE 106 (H) 12/06/2018  ? BUN 9 12/06/2018  ? CREATININE 0.72 12/06/2018  ? BILITOT 0.4 12/06/2018  ? ALKPHOS 167 (H) 12/06/2018  ? AST 20 12/06/2018  ? ALT 14 12/06/2018  ? PROT 6.9 12/06/2018  ? ALBUMIN 2.8 (  L) 12/06/2018  ? CALCIUM 8.6 (L) 12/06/2018  ? GFRAA >60 12/06/2018  ? ? ?Speciality Comments: No specialty comments available. ? ?Procedures:  ?No procedures performed ?Allergies: Patient has no known allergies.  ? ?Assessment / Plan:     ?Visit Diagnoses: No diagnosis found. ? ?*** ? ?Orders: ?No orders of the defined types were placed in this encounter. ? ?No orders of the defined types were placed in this encounter. ? ? ? ?Follow-Up Instructions: No follow-ups on file. ? ? ?Collier Salina, MD ? ?Note - This record has been created using [employer].  ?Chart creation errors have been sought, but may not always  ?have been located. Such creation errors do not reflect on  ?the standard of  medical care. ? ?

## 2021-04-20 ENCOUNTER — Ambulatory Visit: Payer: Medicaid Other | Admitting: Internal Medicine

## 2021-04-24 ENCOUNTER — Ambulatory Visit: Payer: Medicaid Other | Admitting: Internal Medicine

## 2021-04-24 ENCOUNTER — Encounter: Payer: Self-pay | Admitting: Internal Medicine

## 2021-04-24 VITALS — BP 110/73 | HR 91 | Resp 14 | Ht 59.0 in | Wt 106.0 lb

## 2021-04-24 DIAGNOSIS — M79642 Pain in left hand: Secondary | ICD-10-CM | POA: Diagnosis not present

## 2021-04-24 DIAGNOSIS — M79641 Pain in right hand: Secondary | ICD-10-CM | POA: Diagnosis not present

## 2021-04-24 DIAGNOSIS — R768 Other specified abnormal immunological findings in serum: Secondary | ICD-10-CM

## 2021-04-24 NOTE — Progress Notes (Signed)
Office Visit Note  Patient: Victoria Fitzpatrick             Date of Birth: 1990/10/24           MRN: [id number]             PCP: Shanon Rosser, PA-C Referring: Shanon Rosser, PA-C Visit Date: 04/24/2021   Subjective:  Rheumatoid Arthritis (Doing good)   History of Present Illness: Victoria Fitzpatrick is a 31 y.o. female here for follow up with bilateral hand and knee pain significantly positive RF and ESR trying daily NSAID treatment. She stopped taking aleve due to not seeing any difference in her symptoms. She started trying an antiinflammatory diet and some herbal supplements and symptoms are doing well, she has not suffered much trouble in the past 2 weeks. Still stiff just a few minutes in morning times.  Previous HPI 03/19/21 [name] is a 31 y.o. female here for evaluation of bilateral hands and knees joint pains since last year. This started last summer she does not recall any preceding illness or changes before problems started. She has had wrist pain years ago but no prior chronic issue in the current areas. Labs showed RF of 24, ESR of 51, and xrays of hands and knees were unremarkable. She took prednisone for 2 weeks with great improvement of symptoms but returned after finishing the medication. She is taking a daily supplement joint complex containing shellfish products no other medications. Hand and wrist and knee pain bothers her during work but worst stiffness is first thing in the mornings.   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  HENT:  Negative for mouth dryness.   Eyes:  Negative for dryness.  Respiratory:  Negative for shortness of breath.   Cardiovascular:  Negative for swelling in legs/feet.  Gastrointestinal:  Negative for constipation.  Endocrine:  Negative for excessive thirst.  Genitourinary:  Negative for difficulty urinating.  Musculoskeletal:  Positive for morning stiffness.  Skin:  Negative for rash.  Allergic/Immunologic: Negative for susceptible to infections.  Neurological:  Negative for numbness.  Hematological:  Negative for bruising/bleeding tendency.  Psychiatric/Behavioral:  Negative for sleep disturbance.    PMFS History:  Patient Active Problem List   Diagnosis Date Noted   Rheumatoid factor positive 03/19/2021   Bilateral hand pain 03/19/2021   Normal labor 12/06/2018   Supervision of high risk pregnancy, antepartum 06/12/2018   History of low birth weight 06/12/2018   Low grade squamous intraepithelial lesion (LGSIL) on cervical Pap smear 06/12/2018    Past Medical History:  Diagnosis Date   Medical history non-contributory    Rheumatoid arthritis ([place])     Family History  Problem Relation Age of Onset   Liver disease Brother    Past Surgical History:  Procedure Laterality Date   NO PAST SURGERIES     Social History   Social History Narrative   Not on file   Immunization History  Administered Date(s) Administered   Influenza,inj,Quad PF,6+ Mos 10/10/2013, 10/12/2018   Tdap 10/31/2013, 10/12/2018     Objective: Vital Signs: BP 110/73 (BP Location: Left Arm, Patient Position: Sitting, Cuff Size: Normal)   Pulse 91   Resp 14   Ht _0  (1.499 m)   Wt 106 lb (48.1 kg)  BMI 21.41 kg/m    Physical Exam Eyes:     Conjunctiva/sclera: Conjunctivae normal.  Cardiovascular:     Rate and Rhythm: Normal rate and regular rhythm.  Pulmonary:     Effort: Pulmonary effort is normal.     Breath sounds: Normal breath sounds.  Musculoskeletal:     Right lower leg: No edema.     Left lower leg: No edema.  Skin:    General: Skin is warm and dry.     Findings: No rash.  Neurological:     Mental Status: She is alert.  Psychiatric:        Mood and Affect: Mood normal.     Musculoskeletal Exam:   Shoulders full ROM no tenderness or swelling Elbows full ROM no tenderness or swelling Wrists full ROM no tenderness or swelling Fingers full ROM no tenderness or swelling Knees full ROM no tenderness or swelling Ankles full ROM no tenderness or swelling   CDAI Exam: CDAI Score: 0  Patient Global: 0 mm; Provider Global: 0 mm Swollen: 0 ; Tender: 0  Joint Exam 04/24/2021   All documented joints were normal     Investigation: No additional findings.  Imaging: No results found.  Recent Labs: Lab Results  Component Value Date   WBC 7.7 12/06/2018   HGB 12.5 12/06/2018   PLT 207 12/06/2018   NA 136 12/06/2018   K 3.8 12/06/2018   CL 110 12/06/2018   CO2 18 (L) 12/06/2018   GLUCOSE 106 (H) 12/06/2018   BUN 9 12/06/2018   CREATININE 0.72 12/06/2018   BILITOT 0.4 12/06/2018   ALKPHOS 167 (H) 12/06/2018   AST 20 12/06/2018   ALT 14 12/06/2018   PROT 6.9 12/06/2018   ALBUMIN 2.8 (L) 12/06/2018   CALCIUM 8.6 (L) 12/06/2018   GFRAA >60 12/06/2018    Speciality Comments: No specialty comments available.  Procedures:  No procedures performed Allergies: Patient has no known allergies.   Assessment / Plan:     Visit Diagnoses: Bilateral hand pain Rheumatoid factor positive  History and labs do appear consistent for probable early seropositive rheumatoid arthritis but not severe at this time.  No evidence of erosive or deforming changes.  She is not interested in going into long-term DMARD treatment unless needed.  I recommend we can follow-up as needed if she is not getting adequate symptom control with NSAID medications as an initial treatment option.  Orders: No orders of the defined types were placed in this encounter.  No orders of the defined types were placed in this encounter.    Follow-Up Instructions: Return if symptoms worsen or fail to improve, for ?Early RA f/u PRN.   Collier Salina, MD  Note - This record has been created using [employer].  Chart creation errors have been sought, but may not always  have been located. Such creation errors do not reflect on  the standard of medical care.

## 2021-06-01 ENCOUNTER — Encounter (HOSPITAL_COMMUNITY): Payer: Self-pay | Admitting: Emergency Medicine

## 2021-06-01 ENCOUNTER — Other Ambulatory Visit: Payer: Self-pay

## 2021-06-01 ENCOUNTER — Emergency Department (HOSPITAL_COMMUNITY): Payer: Medicaid Other

## 2021-06-01 ENCOUNTER — Emergency Department (HOSPITAL_COMMUNITY)
Admission: EM | Admit: 2021-06-01 | Discharge: 2021-06-01 | Disposition: A | Discharge status: Home / Self Care | Payer: Medicaid Other | Attending: Emergency Medicine | Admitting: Emergency Medicine

## 2021-06-01 DIAGNOSIS — R109 Unspecified abdominal pain: Secondary | ICD-10-CM | POA: Diagnosis not present

## 2021-06-01 DIAGNOSIS — R1032 Left lower quadrant pain: Secondary | ICD-10-CM | POA: Diagnosis not present

## 2021-06-01 DIAGNOSIS — D72829 Elevated white blood cell count, unspecified: Secondary | ICD-10-CM | POA: Diagnosis not present

## 2021-06-01 DIAGNOSIS — E876 Hypokalemia: Secondary | ICD-10-CM | POA: Insufficient documentation

## 2021-06-01 DIAGNOSIS — R000 Tachycardia, unspecified: Secondary | ICD-10-CM | POA: Insufficient documentation

## 2021-06-01 LAB — CBC WITH DIFFERENTIAL/PLATELET
Abs Immature Granulocytes: 0.05 10*3/uL (ref 0.00–0.07)
Basophils Absolute: 0 10*3/uL (ref 0.0–0.1)
Basophils Relative: 0 %
Eosinophils Absolute: 0 10*3/uL (ref 0.0–0.5)
Eosinophils Relative: 1 %
HCT: 35.5 % — ABNORMAL LOW (ref 36.0–46.0)
Hemoglobin: 11.1 g/dL — ABNORMAL LOW (ref 12.0–15.0)
Immature Granulocytes: 1 %
Lymphocytes Relative: 23 %
Lymphs Abs: 0.9 10*3/uL (ref 0.7–4.0)
MCH: 27.5 pg (ref 26.0–34.0)
MCHC: 31.3 g/dL (ref 30.0–36.0)
MCV: 87.9 fL (ref 80.0–100.0)
Monocytes Absolute: 0.3 10*3/uL (ref 0.1–1.0)
Monocytes Relative: 6 %
Neutro Abs: 2.8 10*3/uL (ref 1.7–7.7)
Neutrophils Relative %: 69 %
Platelets: 308 10*3/uL (ref 150–400)
RBC: 4.04 MIL/uL (ref 3.87–5.11)
RDW: 15.5 % (ref 11.5–15.5)
WBC: 4.1 10*3/uL (ref 4.0–10.5)
nRBC: 0 % (ref 0.0–0.2)

## 2021-06-01 LAB — URINALYSIS, ROUTINE W REFLEX MICROSCOPIC
Bilirubin Urine: NEGATIVE
Glucose, UA: NEGATIVE mg/dL
Hgb urine dipstick: NEGATIVE
Ketones, ur: NEGATIVE mg/dL
Nitrite: NEGATIVE
Protein, ur: NEGATIVE mg/dL
Specific Gravity, Urine: 1.015 (ref 1.005–1.030)
pH: 6 (ref 5.0–8.0)

## 2021-06-01 LAB — BASIC METABOLIC PANEL
Anion gap: 5 (ref 5–15)
BUN: 6 mg/dL (ref 6–20)
CO2: 24 mmol/L (ref 22–32)
Calcium: 8.6 mg/dL — ABNORMAL LOW (ref 8.9–10.3)
Chloride: 107 mmol/L (ref 98–111)
Creatinine, Ser: 0.58 mg/dL (ref 0.44–1.00)
GFR, Estimated: >60 mL/min (ref >60)
Glucose, Bld: 96 mg/dL (ref 70–99)
Potassium: 3.3 mmol/L — ABNORMAL LOW (ref 3.5–5.1)
Sodium: 136 mmol/L (ref 135–145)

## 2021-06-01 LAB — I-STAT BETA HCG BLOOD, ED (MC, WL, AP ONLY): I-stat hCG, quantitative: <5 m[IU]/mL (ref <5)

## 2021-06-01 MED ORDER — NAPROXEN 500 MG PO TABS: 500.0000 mg | ORAL_TABLET | Freq: Two times a day (BID) | ORAL | 0 refills | Status: DC

## 2021-06-01 MED ORDER — MORPHINE SULFATE (PF) 4 MG/ML IV SOLN
4.0000 mg | Freq: Once | INTRAVENOUS | Status: AC
  Administered 2021-06-01: 4 mg via INTRAVENOUS
  Filled 2021-06-01: qty 1

## 2021-06-01 MED ORDER — ONDANSETRON HCL 4 MG/2ML IJ SOLN
4.0000 mg | Freq: Once | INTRAMUSCULAR | Status: AC
  Administered 2021-06-01: 4 mg via INTRAVENOUS
  Filled 2021-06-01: qty 2

## 2021-06-01 MED ORDER — KETOROLAC TROMETHAMINE 15 MG/ML IJ SOLN: 15.0000 mg | Freq: Once | INTRAMUSCULAR | Status: DC

## 2021-06-01 MED ORDER — IOHEXOL 300 MG/ML  SOLN
100.0000 mL | Freq: Once | INTRAMUSCULAR | Status: AC | PRN
  Administered 2021-06-01: 100 mL via INTRAVENOUS

## 2021-06-01 NOTE — ED Triage Notes (Signed)
Patient coming from home, complaint of lower left quadrant abdominal pain that started at 5 am yesterday.

## 2021-06-01 NOTE — ED Provider Notes (Signed)
[place] EMERGENCY DEPARTMENT Provider Note   CSN: [id number] Arrival date & time: 06/01/21  0827     History  Chief Complaint  Patient presents with   Abdominal Pain    LLQ    Victoria Fitzpatrick is a 31 y.o. female.  Patient is a 31 year old female with a history of rheumatoid arthritis who is presenting today with complaint of left-sided abdominal pain that started yesterday.  She recalls that it was present about the time she woke up and has been severe in nature.  It has remained on the left side but does radiate into her left flank.  She reports laying down seems to make it a little bit worse but is not affected by eating or certain movements.  She denies any urinary symptoms, bowel changes and last menses finished about 3 days ago.  She has had no vaginal discharge burning or itching.  She denies any nausea vomiting or fever.  She has had no prior abdominal surgeries.  She did take ibuprofen yesterday but reports the pain only minimally improved and remained severe this morning.  No prior history of similar symptoms  The history is provided by the patient.  Abdominal Pain     Home Medications Prior to Admission medications   Medication Sig Start Date End Date Taking? Authorizing Provider  naproxen (NAPROSYN) 500 MG tablet Take 1 tablet (500 mg total) by mouth 2 (two) times daily with a meal. 06/01/21  Yes Blanchie Dessert, MD  acetaminophen (TYLENOL) 325 MG tablet Take 2 tablets (650 mg total) by mouth every 4 (four) hours as needed (for pain scale < 4). Patient not taking: Reported on 04/24/2021 12/08/18   Clarnce Flock, MD  Blood Pressure Monitoring (BLOOD PRESSURE KIT) DEVI 1 Device by Does not apply route once a week. ICD 10: Z34.90 Patient not taking: Reported on 04/24/2021 08/28/18   Anyanwu, Sallyanne Havers, MD  Glucosamine-Chondroit-MSM-C-Mn (FLEXI JOINT PO) Take by mouth.    [provider]  Prenatal Vit-Fe Fumarate-FA (PRENATAL VITAMIN) 27-0.8 MG  TABS Take 1 tablet by mouth daily. Patient not taking: Reported on 01/11/2019 07/04/18   Manya Silvas, [place]      Allergies    Patient has no known allergies.    Review of Systems   Review of Systems  Gastrointestinal:  Positive for abdominal pain.   Physical Exam Updated Vital Signs BP 105/78   Pulse 91   Temp 97.8 F (36.6 C) (Temporal)   Resp 17   SpO2 100%  Physical Exam Vitals and nursing note reviewed.  Constitutional:      General: She is not in acute distress.    Appearance: She is well-developed.  HENT:     Head: Normocephalic and atraumatic.  Eyes:     Pupils: Pupils are equal, round, and reactive to light.  Cardiovascular:     Rate and Rhythm: Regular rhythm. Tachycardia present.     Heart sounds: Normal heart sounds. No murmur heard.   No friction rub.  Pulmonary:     Effort: Pulmonary effort is normal.     Breath sounds: Normal breath sounds. No wheezing or rales.  Abdominal:     General: Bowel sounds are normal. There is no distension.     Palpations: Abdomen is soft.     Tenderness: There is no abdominal tenderness. There is left CVA tenderness. There is no guarding or rebound.  Musculoskeletal:        General: No tenderness. Normal range of motion.  Comments: No edema  Skin:    General: Skin is warm and dry.     Findings: No rash.  Neurological:     Mental Status: She is alert and oriented to person, place, and time.     Cranial Nerves: No cranial nerve deficit.  Psychiatric:        Behavior: Behavior normal.    ED Results / Procedures / Treatments   Labs (all labs ordered are listed, but only abnormal results are displayed) Labs Reviewed  CBC WITH DIFFERENTIAL/PLATELET - Abnormal; Notable for the following components:      Result Value   Hemoglobin 11.1 (*)    HCT 35.5 (*)    All other components within normal limits  URINALYSIS, ROUTINE W REFLEX MICROSCOPIC - Abnormal; Notable for the following components:   APPearance HAZY (*)     Leukocytes,Ua MODERATE (*)    Bacteria, UA RARE (*)    All other components within normal limits  BASIC METABOLIC PANEL - Abnormal; Notable for the following components:   Potassium 3.3 (*)    Calcium 8.6 (*)    All other components within normal limits  I-STAT BETA HCG BLOOD, ED ([facility], [facility], [facility] ONLY)    EKG None  Radiology CT ABDOMEN PELVIS W CONTRAST  Result Date: 06/01/2021 CLINICAL DATA:  Left lower quadrant abdominal pain. EXAM: CT ABDOMEN AND PELVIS WITH CONTRAST TECHNIQUE: Multidetector CT imaging of the abdomen and pelvis was performed using the standard protocol following bolus administration of intravenous contrast. RADIATION DOSE REDUCTION: This exam was performed according to the departmental dose-optimization program which includes automated exposure control, adjustment of the mA and/or kV according to patient size and/or use of iterative reconstruction technique. CONTRAST:  143m OMNIPAQUE IOHEXOL 300 MG/ML  SOLN COMPARISON:  None Available. FINDINGS: Lower chest: No acute abnormality. Hepatobiliary: No focal liver abnormality is seen. No gallstones, gallbladder wall thickening, or biliary dilatation. Pancreas: Unremarkable. No pancreatic ductal dilatation or surrounding inflammatory changes. Spleen: Normal in size without focal abnormality. Adrenals/Urinary Tract: Adrenal glands are unremarkable. Kidneys are normal, without renal calculi, focal lesion, or hydronephrosis. Bladder is unremarkable. Stomach/Bowel: Stomach is within normal limits. Appendix appears normal. No evidence of bowel wall thickening, distention, or inflammatory changes. Vascular/Lymphatic: No significant vascular findings are present. No enlarged abdominal or pelvic lymph nodes. Reproductive: Uterus and bilateral adnexa are unremarkable. Other: No abdominal wall hernia or abnormality. No abdominopelvic ascites. Musculoskeletal: No acute or significant osseous findings. IMPRESSION: No definite abnormality seen in the  abdomen or pelvis. Electronically Signed   By: JMarijo ConceptionM.D.   On: 06/01/2021 10:09    Procedures Procedures    Medications Ordered in ED Medications  ketorolac (TORADOL) 15 MG/ML injection 15 mg (has no administration in time range)  morphine (PF) 4 MG/ML injection 4 mg (4 mg Intravenous Given 06/01/21 0857)  ondansetron (ZOFRAN) injection 4 mg (4 mg Intravenous Given 06/01/21 0857)  iohexol (OMNIPAQUE) 300 MG/ML solution 100 mL (100 mLs Intravenous Contrast Given 06/01/21 04196    ED Course/ Medical Decision Making/ A&P                           Medical Decision Making Amount and/or Complexity of Data Reviewed Labs: ordered. Decision-making details documented in ED Course. Radiology: ordered and independent interpretation performed. Decision-making details documented in ED Course.  Risk Prescription drug management.   Pt with symptoms consistent with kidney stone.  Denies infectious sx, or GI symptoms.  Low  concern for diverticulitis and no risk factors or history suggestive of AAA.  No hx suggestive of GU source (discharge) and otherwise pt is healthy.  No peritoneal signs or abd pain on exam.  Will treat pain and ensure no infection with UA, CBC, BMP and will get stone study to further eval.  12:26 PM I independently interpreted patient's labs and hCG is negative, CBC with normal white count and hemoglobin, BMP with minimal hypokalemia of 3.3 but normal creatinine and UA with moderate leukocytes, no hemoglobin 11-20 white cells but 6-10 epithelial cells most consistent with contamination. I have independently visualized and interpreted pt's images today.  CT today shows no evidence of hydronephrosis or renal stone.  Radiology reports that the CT is negative.  On repeat evaluation patient is still having pain but it is improved after pain medication.  Unclear the cause of the patient's pain today.  At this time does not warrant admission.  Findings discussed with the patient.   Supportive care and return if symptoms worsen.          Final Clinical Impression(s) / ED Diagnoses Final diagnoses:  Left sided abdominal pain of unknown cause    Rx / DC Orders ED Discharge Orders          Ordered    naproxen (NAPROSYN) 500 MG tablet  2 times daily with meals        06/01/21 1226              Blanchie Dessert, MD 06/01/21 1226

## 2021-06-01 NOTE — Discharge Instructions (Signed)
The blood work and the CAT scans look normal today.  Unsure exactly what is causing your pain but everything looks good.  It will hopefully resolve in the next few days but you can take the medication you are prescribed to help with the pain.  If you start having high fever, vomiting, the pain moves or becomes worse return to the emergency room.

## 2021-06-02 ENCOUNTER — Telehealth: Payer: Self-pay

## 2021-06-02 NOTE — Telephone Encounter (Signed)
Transition Care Management Unsuccessful Follow-up Telephone Call  Date of discharge and from where:  06/01/2021-[place]  Attempts:  1st Attempt  Reason for unsuccessful TCM follow-up call:  Unable to reach patient

## 2021-06-03 NOTE — Telephone Encounter (Signed)
Transition Care Management Unsuccessful Follow-up Telephone Call  Date of discharge and from where:  06/01/2021-[place]   Attempts:  2nd Attempt  Reason for unsuccessful TCM follow-up call:  Unable to reach patient

## 2021-06-04 NOTE — Telephone Encounter (Signed)
Transition Care Management Follow-up Telephone Call Date of discharge and from where: 06/01/2021-[place]  How have you been since you were released from the hospital? Patient stated that she is feeling much better and did not have any questions or concerns at this time.  Any questions or concerns? No  Items Reviewed: Did the pt receive and understand the discharge instructions provided? Yes  Medications obtained and verified? Yes  Other? No  Any new allergies since your discharge? No  Dietary orders reviewed? No Do you have support at home? Yes   Functional Questionnaire: (I = Independent and D = Dependent) ADLs: I  Bathing/Dressing- I  Meal Prep- I  Eating- I  Maintaining continence- I  Transferring/Ambulation- I  Managing Meds- I   Follow up appointments reviewed:  PCP Hospital f/u appt confirmed? No   Specialist Hospital f/u appt confirmed? No   Are transportation arrangements needed? No  If their condition worsens, is the pt aware to call PCP or go to the Emergency Dept.? Yes Was the patient provided with contact information for the PCP's office or ED? Yes Was to pt encouraged to call back with questions or concerns? Yes

## 2021-08-22 ENCOUNTER — Inpatient Hospital Stay (HOSPITAL_COMMUNITY)
Admission: EM | Admit: 2021-08-22 | Discharge: 2021-08-24 | DRG: 443 | Disposition: A | Discharge status: Home / Self Care | Payer: Medicaid Other | Source: Ambulatory Visit | Attending: Internal Medicine | Admitting: Internal Medicine

## 2021-08-22 ENCOUNTER — Ambulatory Visit (HOSPITAL_COMMUNITY)
Admission: EM | Admit: 2021-08-22 | Discharge: 2021-08-22 | Disposition: A | Discharge status: Other Acute Inpatient Hospital | Payer: Medicaid Other | Attending: Internal Medicine | Admitting: Internal Medicine

## 2021-08-22 ENCOUNTER — Encounter (HOSPITAL_COMMUNITY): Payer: Self-pay | Admitting: Emergency Medicine

## 2021-08-22 ENCOUNTER — Other Ambulatory Visit: Payer: Self-pay

## 2021-08-22 ENCOUNTER — Emergency Department (HOSPITAL_COMMUNITY): Payer: Medicaid Other

## 2021-08-22 DIAGNOSIS — M25472 Effusion, left ankle: Secondary | ICD-10-CM

## 2021-08-22 DIAGNOSIS — Z8615 Personal history of latent tuberculosis infection: Secondary | ICD-10-CM

## 2021-08-22 DIAGNOSIS — R6510 Systemic inflammatory response syndrome (SIRS) of non-infectious origin without acute organ dysfunction: Secondary | ICD-10-CM | POA: Diagnosis not present

## 2021-08-22 DIAGNOSIS — R509 Fever, unspecified: Secondary | ICD-10-CM

## 2021-08-22 DIAGNOSIS — Z20822 Contact with and (suspected) exposure to covid-19: Secondary | ICD-10-CM | POA: Diagnosis present

## 2021-08-22 DIAGNOSIS — R21 Rash and other nonspecific skin eruption: Secondary | ICD-10-CM

## 2021-08-22 DIAGNOSIS — B169 Acute hepatitis B without delta-agent and without hepatic coma: Principal | ICD-10-CM

## 2021-08-22 DIAGNOSIS — R000 Tachycardia, unspecified: Secondary | ICD-10-CM | POA: Diagnosis not present

## 2021-08-22 DIAGNOSIS — M25471 Effusion, right ankle: Secondary | ICD-10-CM | POA: Diagnosis not present

## 2021-08-22 DIAGNOSIS — Z79899 Other long term (current) drug therapy: Secondary | ICD-10-CM

## 2021-08-22 DIAGNOSIS — M069 Rheumatoid arthritis, unspecified: Secondary | ICD-10-CM | POA: Diagnosis present

## 2021-08-22 DIAGNOSIS — L03211 Cellulitis of face: Secondary | ICD-10-CM | POA: Diagnosis not present

## 2021-08-22 DIAGNOSIS — L539 Erythematous condition, unspecified: Secondary | ICD-10-CM | POA: Diagnosis present

## 2021-08-22 DIAGNOSIS — M25473 Effusion, unspecified ankle: Secondary | ICD-10-CM

## 2021-08-22 DIAGNOSIS — R079 Chest pain, unspecified: Secondary | ICD-10-CM | POA: Diagnosis not present

## 2021-08-22 DIAGNOSIS — M7989 Other specified soft tissue disorders: Secondary | ICD-10-CM | POA: Diagnosis not present

## 2021-08-22 LAB — CBC WITH DIFFERENTIAL/PLATELET
Abs Immature Granulocytes: 0.03 10*3/uL (ref 0.00–0.07)
Basophils Absolute: 0 10*3/uL (ref 0.0–0.1)
Basophils Relative: 0 %
Eosinophils Absolute: 0 10*3/uL (ref 0.0–0.5)
Eosinophils Relative: 0 %
HCT: 34.7 % — ABNORMAL LOW (ref 36.0–46.0)
Hemoglobin: 10.9 g/dL — ABNORMAL LOW (ref 12.0–15.0)
Immature Granulocytes: 1 %
Lymphocytes Relative: 20 %
Lymphs Abs: 0.7 10*3/uL (ref 0.7–4.0)
MCH: 27.5 pg (ref 26.0–34.0)
MCHC: 31.4 g/dL (ref 30.0–36.0)
MCV: 87.6 fL (ref 80.0–100.0)
Monocytes Absolute: 0.1 10*3/uL (ref 0.1–1.0)
Monocytes Relative: 4 %
Neutro Abs: 2.8 10*3/uL (ref 1.7–7.7)
Neutrophils Relative %: 75 %
Platelets: 308 10*3/uL (ref 150–400)
RBC: 3.96 MIL/uL (ref 3.87–5.11)
RDW: 14.8 % (ref 11.5–15.5)
WBC: 3.6 10*3/uL — ABNORMAL LOW (ref 4.0–10.5)
nRBC: 0 % (ref 0.0–0.2)

## 2021-08-22 LAB — TROPONIN I (HIGH SENSITIVITY)
Troponin I (High Sensitivity): 6 ng/L (ref <18)
Troponin I (High Sensitivity): 6 ng/L (ref <18)

## 2021-08-22 LAB — COMPREHENSIVE METABOLIC PANEL
ALT: 94 U/L — ABNORMAL HIGH (ref 0–44)
AST: 133 U/L — ABNORMAL HIGH (ref 15–41)
Albumin: 2.6 g/dL — ABNORMAL LOW (ref 3.5–5.0)
Alkaline Phosphatase: 119 U/L (ref 38–126)
Anion gap: 6 (ref 5–15)
BUN: 11 mg/dL (ref 6–20)
CO2: 22 mmol/L (ref 22–32)
Calcium: 7.9 mg/dL — ABNORMAL LOW (ref 8.9–10.3)
Chloride: 105 mmol/L (ref 98–111)
Creatinine, Ser: 0.6 mg/dL (ref 0.44–1.00)
GFR, Estimated: >60 mL/min (ref >60)
Glucose, Bld: 98 mg/dL (ref 70–99)
Potassium: 3.7 mmol/L (ref 3.5–5.1)
Sodium: 133 mmol/L — ABNORMAL LOW (ref 135–145)
Total Bilirubin: 0.5 mg/dL (ref 0.3–1.2)
Total Protein: 7.8 g/dL (ref 6.5–8.1)

## 2021-08-22 LAB — URINALYSIS, ROUTINE W REFLEX MICROSCOPIC
Bilirubin Urine: NEGATIVE
Glucose, UA: NEGATIVE mg/dL
Hgb urine dipstick: NEGATIVE
Ketones, ur: NEGATIVE mg/dL
Leukocytes,Ua: NEGATIVE
Nitrite: NEGATIVE
Protein, ur: NEGATIVE mg/dL
Specific Gravity, Urine: 1.001 — ABNORMAL LOW (ref 1.005–1.030)
pH: 6 (ref 5.0–8.0)

## 2021-08-22 LAB — RAPID URINE DRUG SCREEN, HOSP PERFORMED
Amphetamines: NOT DETECTED
Barbiturates: NOT DETECTED
Benzodiazepines: NOT DETECTED
Cocaine: NOT DETECTED
Opiates: NOT DETECTED
Tetrahydrocannabinol: NOT DETECTED

## 2021-08-22 LAB — APTT: aPTT: 34 seconds (ref 24–36)

## 2021-08-22 LAB — PROTIME-INR
INR: 0.9 (ref 0.8–1.2)
Prothrombin Time: 11.9 seconds (ref 11.4–15.2)

## 2021-08-22 LAB — HEPATITIS PANEL, ACUTE
HCV Ab: NONREACTIVE
Hep A IgM: NONREACTIVE
Hep B C IgM: REACTIVE — AB
Hepatitis B Surface Ag: NONREACTIVE

## 2021-08-22 LAB — POCT URINALYSIS DIPSTICK, ED / UC
Bilirubin Urine: NEGATIVE
Glucose, UA: NEGATIVE mg/dL
Hgb urine dipstick: NEGATIVE
Ketones, ur: NEGATIVE mg/dL
Leukocytes,Ua: NEGATIVE
Nitrite: NEGATIVE
Protein, ur: NEGATIVE mg/dL
Specific Gravity, Urine: 1.02 (ref 1.005–1.030)
Urobilinogen, UA: 1 mg/dL (ref 0.0–1.0)
pH: 6.5 (ref 5.0–8.0)

## 2021-08-22 LAB — RAPID HIV SCREEN (HIV 1/2 AB+AG)
HIV 1/2 Antibodies: NONREACTIVE
HIV-1 P24 Antigen - HIV24: NONREACTIVE

## 2021-08-22 LAB — RESP PANEL BY RT-PCR (FLU A&B, COVID) ARPGX2
Influenza A by PCR: NEGATIVE
Influenza B by PCR: NEGATIVE
SARS Coronavirus 2 by RT PCR: NEGATIVE

## 2021-08-22 LAB — I-STAT BETA HCG BLOOD, ED (MC, WL, AP ONLY): I-stat hCG, quantitative: <5 m[IU]/mL (ref <5)

## 2021-08-22 LAB — BRAIN NATRIURETIC PEPTIDE: B Natriuretic Peptide: 7.5 pg/mL (ref 0.0–100.0)

## 2021-08-22 LAB — LACTIC ACID, PLASMA: Lactic Acid, Venous: 1 mmol/L (ref 0.5–1.9)

## 2021-08-22 LAB — TSH: TSH: 2.162 u[IU]/mL (ref 0.350–4.500)

## 2021-08-22 MED ORDER — ENOXAPARIN SODIUM 30 MG/0.3ML IJ SOSY
30.0000 mg | PREFILLED_SYRINGE | INTRAMUSCULAR | Status: DC
  Administered 2021-08-22 – 2021-08-23 (×2): 30 mg via SUBCUTANEOUS
  Filled 2021-08-22 (×2): qty 0.3

## 2021-08-22 MED ORDER — ACETAMINOPHEN 650 MG RE SUPP: 650.0000 mg | Freq: Four times a day (QID) | RECTAL | Status: DC | PRN

## 2021-08-22 MED ORDER — LACTATED RINGERS IV BOLUS
500.0000 mL | Freq: Once | INTRAVENOUS | Status: AC
  Administered 2021-08-22: 500 mL via INTRAVENOUS

## 2021-08-22 MED ORDER — ACETAMINOPHEN 325 MG PO TABS
650.0000 mg | ORAL_TABLET | Freq: Four times a day (QID) | ORAL | Status: DC | PRN
  Administered 2021-08-23 – 2021-08-24 (×2): 650 mg via ORAL
  Filled 2021-08-22 (×3): qty 2

## 2021-08-22 MED ORDER — ACETAMINOPHEN 325 MG PO TABS
ORAL_TABLET | ORAL | Status: AC
  Filled 2021-08-22: qty 2

## 2021-08-22 MED ORDER — SODIUM CHLORIDE 0.9% FLUSH
3.0000 mL | Freq: Two times a day (BID) | INTRAVENOUS | Status: DC
  Administered 2021-08-23 – 2021-08-24 (×2): 3 mL via INTRAVENOUS

## 2021-08-22 MED ORDER — ACETAMINOPHEN 325 MG PO TABS
650.0000 mg | ORAL_TABLET | Freq: Once | ORAL | Status: AC
  Administered 2021-08-22: 650 mg via ORAL

## 2021-08-22 MED ORDER — DOXYCYCLINE HYCLATE 100 MG PO TABS
100.0000 mg | ORAL_TABLET | Freq: Once | ORAL | Status: AC
  Administered 2021-08-22: 100 mg via ORAL
  Filled 2021-08-22: qty 1

## 2021-08-22 MED ORDER — SODIUM CHLORIDE 0.9 % IV BOLUS
500.0000 mL | Freq: Once | INTRAVENOUS | Status: AC
  Administered 2021-08-22: 500 mL via INTRAVENOUS

## 2021-08-22 MED ORDER — ACETAMINOPHEN 325 MG PO TABS
650.0000 mg | ORAL_TABLET | Freq: Once | ORAL | Status: AC
  Administered 2021-08-22: 650 mg via ORAL
  Filled 2021-08-22: qty 2

## 2021-08-22 MED ORDER — LACTATED RINGERS IV BOLUS (SEPSIS)
1000.0000 mL | Freq: Once | INTRAVENOUS | Status: AC
  Administered 2021-08-22: 1000 mL via INTRAVENOUS

## 2021-08-22 MED ORDER — POLYETHYLENE GLYCOL 3350 17 G PO PACK: 17.0000 g | PACK | Freq: Every day | ORAL | Status: DC | PRN

## 2021-08-22 NOTE — ED Provider Notes (Signed)
[facility]    CSN: [id number] Arrival date & time: 08/22/21  1036      History   Chief Complaint Chief Complaint  Patient presents with   Leg Swelling   Fever    HPI Victoria Fitzpatrick is a 31 y.o. female.   Patient presents with several different chief complaints today.  Patient reports bilateral ankle swelling that has been daily for the past month.  Also reports associated pain.  Patient does have a history of rheumatoid arthritis and states that this feels similar.  Denies any injury to the area.  Denies any prolonged sitting or standing on a daily basis.  Denies chest pain, shortness of breath, headache, dizziness, blurred vision, nausea or vomiting.  Denies any history of heart problems.  Patient also reports fever that has been present for about 3 days.  Denies any associated runny nose, nasal congestion, cough, sore throat, nausea, vomiting, diarrhea, abdominal pain.  Patient reports that she has had associated sores on hands and feet that has been present for about a week as well.  Denies any known sick contacts.   Patient also reports that she has area of redness and swelling/sore to right cheek that has been present for about a week as well.  Denies any drainage from the area.  Denies any injury to the area.   Fever   Past Medical History:  Diagnosis Date   Medical history non-contributory    Rheumatoid arthritis [facility])     Patient Active Problem List   Diagnosis Date Noted   Rheumatoid factor positive 03/19/2021   Bilateral hand pain 03/19/2021   Normal labor 12/06/2018   Supervision of high risk pregnancy, antepartum 06/12/2018   History of low birth weight 06/12/2018   Low grade squamous intraepithelial lesion (LGSIL) on cervical Pap smear 06/12/2018    Past Surgical History:  Procedure Laterality Date   NO PAST SURGERIES      OB History     Gravida  3   Para  3   Term  3   Preterm      AB      Living  3      SAB      IAB       Ectopic      Multiple  0   Live Births  3            Home Medications    Prior to Admission medications   Medication Sig Start Date End Date Taking? Authorizing Provider  acetaminophen (TYLENOL) 325 MG tablet Take 2 tablets (650 mg total) by mouth every 4 (four) hours as needed (for pain scale < 4). Patient not taking: Reported on 04/24/2021 12/08/18   Venora Maples, MD  Blood Pressure Monitoring (BLOOD PRESSURE KIT) DEVI 1 Device by Does not apply route once a week. ICD 10: Z34.90 Patient not taking: Reported on 04/24/2021 08/28/18   Anyanwu, Jethro Bastos, MD  Glucosamine-Chondroit-MSM-C-Mn (FLEXI JOINT PO) Take by mouth.    [provider]  naproxen (NAPROSYN) 500 MG tablet Take 1 tablet (500 mg total) by mouth 2 (two) times daily with a meal. 06/01/21   Gwyneth Sprout, MD  Prenatal Vit-Fe Fumarate-FA (PRENATAL VITAMIN) 27-0.8 MG TABS Take 1 tablet by mouth daily. Patient not taking: Reported on 01/11/2019 07/04/18   Dorathy Kinsman, CNM    Family History Family History  Problem Relation Age of Onset   Liver disease Brother     Social History Social History  Tobacco Use   Smoking status: Never   Smokeless tobacco: Never  Vaping Use   Vaping Use: Never used  Substance Use Topics   Alcohol use: No   Drug use: No     Allergies   Patient has no known allergies.   Review of Systems Review of Systems Per HPI  Physical Exam Triage Vital Signs ED Triage Vitals [08/22/21 1140]  Enc Vitals Group     BP 115/77     Pulse Rate (!) 138     Resp 18     Temp (!) 101.7 F (38.7 C)     Temp Source Oral     SpO2 99 %     Weight      Height      Head Circumference      Peak Flow      Pain Score 5     Pain Loc      Pain Edu?      Excl. in [place]?    No data found.  Updated Vital Signs BP 115/77 (BP Location: Left Arm)   Pulse (!) 144   Temp 98.3 F (36.8 C)   Resp 18   SpO2 99%   Visual Acuity Right Eye Distance:   Left Eye Distance:    Bilateral Distance:    Right Eye Near:   Left Eye Near:    Bilateral Near:     Physical Exam Constitutional:      General: She is not in acute distress.    Appearance: Normal appearance. She is not toxic-appearing or diaphoretic.  HENT:     Head: Normocephalic and atraumatic.      Comments: Erythematous circular area with crusty center present to cheek of right face.  It is flat with no induration or fluctuance.  Approximately 2.5 cm in diameter.    Right Ear: Tympanic membrane and ear canal normal.     Left Ear: Tympanic membrane and ear canal normal.     Nose: Nose normal.     Mouth/Throat:     Mouth: Mucous membranes are moist.     Pharynx: No posterior oropharyngeal erythema.  Eyes:     Extraocular Movements: Extraocular movements intact.     Conjunctiva/sclera: Conjunctivae normal.  Cardiovascular:     Rate and Rhythm: Regular rhythm. Tachycardia present.     Pulses: Normal pulses.     Heart sounds: Normal heart sounds.  Pulmonary:     Effort: Pulmonary effort is normal. No respiratory distress.     Breath sounds: Normal breath sounds. No stridor. No wheezing, rhonchi or rales.  Skin:    Comments: Nonpitting edema circumferential to bilateral ankles.  Also has pain to palpation to these areas.  No discoloration, warmth, lacerations, abrasions noted.  Capillary refill and pulses normal.  Patient has vesicular, erythematous, papular lesions scattered throughout palms of hands and toes of feet.  No drainage noted.    Neurological:     General: No focal deficit present.     Mental Status: She is alert and oriented to person, place, and time. Mental status is at baseline.  Psychiatric:        Mood and Affect: Mood normal.        Behavior: Behavior normal.        Thought Content: Thought content normal.        Judgment: Judgment normal.      UC Treatments / Results  Labs (all labs ordered are listed, but only abnormal results are displayed) Labs  Reviewed  POCT  URINALYSIS DIPSTICK, ED / UC    EKG   Radiology No results found.  Procedures Procedures (including critical care time)  Medications Ordered in UC Medications  acetaminophen (TYLENOL) tablet 650 mg (650 mg Oral Given 08/22/21 1145)    Initial Impression / Assessment and Plan / UC Course  I have reviewed the triage vital signs and the nursing notes.  Pertinent labs & imaging results that were available during my care of the patient were reviewed by me and considered in my medical decision making (see chart for details).     Originally suspected that bilateral ankle swelling could be rheumatoid arthritis flareup but it is not definitive for this on physical exam especially given associated tachycardia.  Suspect that patient possibly has hand, foot, mouth disease given rash to hands and feet and in fever as well.  Patient also appears to have cellulitis of face.  Patient had fever and tachycardia on initial triage.  Tylenol was administered with improvement in fever but no improvement in heart rate.  Originally suspected tachycardia could be related to fever but it did not improve with fever and Tylenol administration.  EKG was completed showing sinus tachycardia.  Patient has no prior history of heart problems or tachycardia.  Given no prior history and no improvement with Tylenol, there is concern for possible systemic infection given patient's multiple associated symptoms including rash, possible cellulitis of face, bilateral ankle swelling.  With this concern, I do think patient needs a more extensive evaluation than can be provided at the urgent care.  Patient was advised to go to the ER for further evaluation and management.  Patient verbalized understanding and was agreeable with plan.  Vital signs and patient stable at discharge.  Agree with patient self transport to hospital.     Final Clinical Impressions(s) / UC Diagnoses   Final diagnoses:  Cellulitis, face  Ankle swelling,  unspecified laterality  Fever, unspecified  Rash and nonspecific skin eruption  Tachycardia     Discharge Instructions      Please go to the emergency department as soon as you leave urgent care for further evaluation and management.     ED Prescriptions   None    PDMP not reviewed this encounter.   Teodora Medici, [place] 08/22/21 1250

## 2021-08-22 NOTE — ED Triage Notes (Addendum)
Patient presented to [facility] for bilateral ankle swelling x1 month w/ increase in pain today. Pt sent here from [facility] because her HR was in the 140s but she also had a fever which they treated with tylenol, however HR still in the 140s.  Pt denies any other complaints, denies chest pain. Endorses SHOB.Denies abdominal pain. AOX4. No distress noted at this time.

## 2021-08-22 NOTE — Discharge Instructions (Addendum)
Please go to the emergency department as soon as you leave urgent care for further evaluation and management. ?

## 2021-08-22 NOTE — ED Provider Notes (Signed)
I provided a substantive portion of the care of this patient.  I personally performed the entirety of the history for this encounter. {Remember to document shared critical care using "edcritical" dot phrase:1}    

## 2021-08-22 NOTE — ED Notes (Signed)
Patient is being discharged from the Urgent Care and sent to the Emergency Department via POV . Per HM, patient is in need of higher level of care due to tachycardia/fever. Patient is aware and verbalizes understanding of plan of care.  Vitals:   08/22/21 1140 08/22/21 1242  BP: 115/77   Pulse: (!) 138 (!) 144  Resp: 18   Temp: (!) 101.7 F (38.7 C) 98.3 F (36.8 C)  SpO2: 99%

## 2021-08-22 NOTE — ED Provider Notes (Signed)
[place] EMERGENCY DEPARTMENT Provider Note   CSN: [id number] Arrival date & time: 08/22/21  1254     History  Chief Complaint  Patient presents with   Leg Swelling   Tachycardia    Victoria Fitzpatrick is a 31 y.o. female. With past medical history of rheumatoid arthritis who presents to the emergency department with lower extremity swelling and tachycardia.  Patient states that she has had bilateral ankle swelling for about 1 month.  She states that her ankles began hurting so she went to urgent care today.  At urgent care she was tachycardic to 140s and noted to have a fever of 100 as well as rash so she was sent to the emergency department.  Patient states that she has had a rash on her palms, soles for about 1 week.  Additionally she has had a fever for about 3 days at home around 100.  She denies children having rash or working with children.  She denies chest pain, shortness of breath, cough, abdominal pain, nausea, vomiting, diarrhea, urinary symptoms.  She states that she has a history of rheumatoid arthritis but is not on DMARDs.  She denies recent traveling, hiking, camping.  HPI     Home Medications Prior to Admission medications   Medication Sig Start Date End Date Taking? Authorizing Provider  acetaminophen (TYLENOL) 325 MG tablet Take 2 tablets (650 mg total) by mouth every 4 (four) hours as needed (for pain scale < 4). Patient not taking: Reported on 04/24/2021 12/08/18   Clarnce Flock, MD  Blood Pressure Monitoring (BLOOD PRESSURE KIT) DEVI 1 Device by Does not apply route once a week. ICD 10: Z34.90 Patient not taking: Reported on 04/24/2021 08/28/18   Anyanwu, Sallyanne Havers, MD  Glucosamine-Chondroit-MSM-C-Mn (FLEXI JOINT PO) Take by mouth.    [provider]  naproxen (NAPROSYN) 500 MG tablet Take 1 tablet (500 mg total) by mouth 2 (two) times daily with a meal. 06/01/21   Blanchie Dessert, MD  Prenatal Vit-Fe Fumarate-FA (PRENATAL VITAMIN) 27-0.8  MG TABS Take 1 tablet by mouth daily. Patient not taking: Reported on 01/11/2019 07/04/18   Manya Silvas, [place]      Allergies    Patient has no known allergies.    Review of Systems   Review of Systems  Constitutional:  Positive for fatigue and fever.  Cardiovascular:  Positive for leg swelling.  Skin:  Positive for rash.  All other systems reviewed and are negative.   Physical Exam Updated Vital Signs BP 112/82 (BP Location: Right Arm)   Pulse (!) 140   Temp 100 F (37.8 C) (Oral)   Resp 20   SpO2 99%  Physical Exam Vitals and nursing note reviewed.  Constitutional:      Appearance: Normal appearance. She is ill-appearing.  HENT:     Head: Normocephalic and atraumatic.     Mouth/Throat:     Mouth: Mucous membranes are moist.     Pharynx: Oropharynx is clear. Posterior oropharyngeal erythema present.  Eyes:     General: No scleral icterus.    Extraocular Movements: Extraocular movements intact.     Conjunctiva/sclera: Conjunctivae normal.     Pupils: Pupils are equal, round, and reactive to light.  Cardiovascular:     Rate and Rhythm: Regular rhythm. Tachycardia present.     Pulses: Normal pulses.     Heart sounds: No murmur heard. Pulmonary:     Effort: Pulmonary effort is normal. No respiratory distress.     Breath  sounds: Normal breath sounds.  Abdominal:     General: Bowel sounds are normal.     Palpations: Abdomen is soft.  Musculoskeletal:        General: Tenderness present.     Cervical back: Neck supple.     Right lower leg: Edema present.     Left lower leg: Edema present.  Skin:    General: Skin is warm and dry.     Capillary Refill: Capillary refill takes less than 2 seconds.     Coloration: Skin is not jaundiced.     Findings: Rash present.     Comments: Bilateral shins with red excoriation from itching  Circular raised, erythematous rash to the right cheek   Neurological:     General: No focal deficit present.     Mental Status: She is alert  and oriented to person, place, and time. Mental status is at baseline.  Psychiatric:        Mood and Affect: Mood normal.        Behavior: Behavior normal.        Thought Content: Thought content normal.        Judgment: Judgment normal.        ED Results / Procedures / Treatments   Labs (all labs ordered are listed, but only abnormal results are displayed) Labs Reviewed  COMPREHENSIVE METABOLIC PANEL - Abnormal; Notable for the following components:      Result Value   Sodium 133 (*)    Calcium 7.9 (*)    Albumin 2.6 (*)    AST 133 (*)    ALT 94 (*)    All other components within normal limits  CBC WITH DIFFERENTIAL/PLATELET - Abnormal; Notable for the following components:   WBC 3.6 (*)    Hemoglobin 10.9 (*)    HCT 34.7 (*)    All other components within normal limits  CULTURE, BLOOD (ROUTINE X 2)  CULTURE, BLOOD (ROUTINE X 2)  RESP PANEL BY RT-PCR (FLU A&B, COVID) ARPGX2  URINE CULTURE  RAPID HIV SCREEN (HIV 1/2 AB+AG)  BRAIN NATRIURETIC PEPTIDE  RPR  ROCKY MTN SPOTTED FVR ABS PNL(IGG+IGM)  RAPID URINE DRUG SCREEN, HOSP PERFORMED  TSH  URINALYSIS, ROUTINE W REFLEX MICROSCOPIC  LACTIC ACID, PLASMA  LACTIC ACID, PLASMA  PROTIME-INR  APTT  HEPATITIS PANEL, ACUTE  EHRLICHIA ANTIBODY PANEL  I-STAT BETA HCG BLOOD, ED (MC, WL, AP ONLY)  TROPONIN I (HIGH SENSITIVITY)  TROPONIN I (HIGH SENSITIVITY)   EKG None  Radiology No results found.  Procedures Procedures   Medications Ordered in ED Medications  lactated ringers bolus 1,000 mL (has no administration in time range)  lactated ringers bolus 500 mL (has no administration in time range)    ED Course/ Medical Decision Making/ A&P Clinical Course as of 08/22/21 1513  Sat Aug 22, 2021  1459 Bil ankle swelling x 1 month Rash  Tachy, febrile No dmard or immunosuppressant R/o menn and pe [CR]    Clinical Course User Index [CR] Wilnette Kales, PA                           Medical Decision  Making Amount and/or Complexity of Data Reviewed Labs: ordered. Radiology: ordered.  Risk OTC drugs.  Care of patient being handed off to [place], [place].  Please see his note for the continuation of care.  Briefly, 31 year old female with new lower extremity swelling, rash, fever, tachycardia.  She is pending the  majority of her work-up at time of handoff.  She does have a very mild hyponatremia to 133, new transaminitis, leukopenia, anemia. She is getting fluids for her tachycardia and Tylenol. Obtaining blood cultures and lactic as well as HIV, RPR, [place] spotted fever.  Given her transaminitis obtaining a hepatitis panel as well. Time of handoff, unsure about disposition.  If her lab results are all unremarkable, her tachycardia improved with fluids and fever improves, she may be discharged with p.o. doxycycline and close follow-up.  If the rest of her labs dictate admission or if she continues to be tachycardic and febrile despite treatment she will need admission.  Final Clinical Impression(s) / ED Diagnoses Final diagnoses:  None    Rx / DC Orders ED Discharge Orders     None         Mickie Hillier, PA-C 08/22/21 1515    Charlesetta Shanks, MD 08/25/21 1526

## 2021-08-22 NOTE — ED Triage Notes (Signed)
Pt here for bilateral ankle swelling x 1 month worse at evening; pt also noted to have fever x 3 days

## 2021-08-22 NOTE — ED Provider Notes (Signed)
Physical Exam  BP 112/82 (BP Location: Right Arm)   Pulse (!) 140   Temp 100 F (37.8 C) (Oral)   Resp 20   SpO2 99%   Physical Exam Vitals and nursing note reviewed.  Constitutional:      General: She is not in acute distress.    Appearance: Normal appearance. She is well-developed.  HENT:     Head: Normocephalic and atraumatic.  Eyes:     Conjunctiva/sclera: Conjunctivae normal.  Neck:     Comments: Neck supple with no evidence of meningismus. Cardiovascular:     Rate and Rhythm: Regular rhythm. Tachycardia present.     Heart sounds: No murmur heard. Pulmonary:     Effort: Pulmonary effort is normal. No respiratory distress.     Breath sounds: Normal breath sounds.  Abdominal:     Palpations: Abdomen is soft.     Tenderness: There is no abdominal tenderness.  Musculoskeletal:        General: No swelling.     Cervical back: Neck supple. No rigidity.     Right lower leg: Edema present.     Left lower leg: Edema present.     Comments: 1-2+ pitting edema noted in bilateral lower extremities.   Skin:    General: Skin is warm and dry.     Capillary Refill: Capillary refill takes less than 2 seconds.     Comments: Rash depicted in media located on patient's upper and lower extremities.  Neurological:     Mental Status: She is alert.  Psychiatric:        Mood and Affect: Mood normal.     Procedures  Procedures  ED Course / MDM   Clinical Course as of 08/22/21 2215  Sat Aug 22, 2021  1833 Dr. Margot Ables of hospital medicine.  He recommended consultation with infectious disease and reevaluation once their input has been levied. [CR]  1847 Consulted Dr. Meribeth Mattes of infectious disease.  She recommended Ehrlichia antibody, RPR, recommend spotted fever laboratory [CR]    Clinical Course User Index [CR] Peter Garter, PA   Medical Decision Making Amount and/or Complexity of Data Reviewed Labs: ordered. Decision-making details documented in ED  Course. Radiology: ordered.  Risk OTC drugs. Decision regarding hospitalization.   Patient care handed off from Mertha Baars, PA-C at shift change.  Patient has numerous labs pending with unsure etiology of patient's symptoms.  Plan is to reassess after laboratory studies result and formulate plan thereafter.  See prior note for full HPI/PE  In short, patient has had 1 month history of bilateral lower extremity swelling.  She has a history of rheumatoid arthritis but states she has never had swelling of her bilateral lower extremities like this before.  She states she was recently diagnosed approximately 1 year ago with rheumatoid arthritis and has not been on any immunosuppressive medication besides 2 weeks in January with a steroid pack.  She has no associated shortness of breath or chest pain, history of DVT/PE, hormonal therapy, recent surgery, immobilization, known trauma.  She also notes 1 week history of rash that began on the feet and hands with slight proximal progression.  She denies any known tick bite but does states she was outside fishing approximately 2 to 3 weeks ago.  Denies any known sick contact or working with children with potential exposure to hand-foot-and-mouth.  She denies any concern for STD/STI.  She also notes she has had a red area pop up on her right cheek.  She states that the area was initially pimple-like with associated itchiness.  She tried to pop the area and surrounding redness began to worsen.  No leukocytosis.  Mild anemia with a hemoglobin of 10.9; this is slightly decreased from patient's baseline the patient reports only known blood losses through menstrual cycle.  Patient has mild hyponatremia with a sodium 133; this was supplemented with normal saline.  Patient has transaminitis with an AST of 133 and ALT of 94.  Hepatitis panel was ordered however she was hepatitis B core IgM positive with hepatitis B surface antigen negative.  UDS negative.  Troponin negative  delta.  Lactate within normal range.  aPTT and prothrombin INR within normal range.  Blood cultures, RPR, Ehrlichia antibody, recommend spotted fever pending upon admission.  Given persistent febrile and tachycardic nature of patient as well as accompanied rash, admission deemed most beneficial for further work-up.  Infectious disease was consulted as depicted in ED course.  Hospital medicine was consulted and was agreeable to admission and assume further treatment/care of patient.  Treatment care was discussed with patient and the patient knowledge understanding was agreeable to said plan.  Patient was stable upon admission.     Peter Garter, [place] 08/22/21 2235    Vanetta Mulders, MD 09/03/21 1759

## 2021-08-22 NOTE — H&P (Signed)
History and Physical   Victoria Fitzpatrick KGM:[id number] DOB: 06/20/1990 DOA: 08/22/2021  PCP: Shanon Rosser, PA-C   Patient coming from: Home  Chief Complaint: Fever, tachycardia, edema  HPI: Victoria Fitzpatrick is a 31 y.o. female with medical history significant of rheumatoid arthritis presenting with tachycardia, fever, edema.  Patient reports ongoing ankle edema for the past month.  She began to have pain in her ankle today so she was evaluated in urgent care while there she was found to have fever rash and tachycardia and she was into the ED for further evaluation.  She was reporting rash on her palms and soles for about a week and fever for the past several days around 100 Fahrenheit at home.  Denies any sick contacts.  Denies any known tick bites.  Denies chills, chest pain, shortness of breath, abdominal pain, constipation, diarrhea, nausea, vomiting.  ED Course: Vital signs in the ED significant for fever to 101.7, heart rate initially in the 130s improved to the 100s with treatment.  Lab work-up included CMP with sodium 133, calcium 7.9, albumin 2.6, AST 133, ALT 94.  CBC with leukopenia at 3.6, anemia stable at 10.9.  PT, PTT, INR within normal limits.  Troponin negative x2.  BNP normal.  TSH normal.  Lactic acid normal.  Rester panel for flu and COVID-negative.  Urinalysis normal.  Urine culture and blood cultures pending.  HIV negative.  RPR and recommend spotted fever labs pending.  UDS negative.  Hepatitis panel positive for hepatitis B core IgM only.  Early helicis labs pending.  Chest x-ray showed no acute abnormality.  Patient received 1.5 L of fluids, dose of Tylenol, doxycycline.  ID was consulted and recommended checking tick labs which are in process and that they will see the patient if admitted.  Review of Systems: As per HPI otherwise all other systems reviewed and are negative.  Past Medical History:  Diagnosis Date   Medical history non-contributory    Rheumatoid arthritis ([place])      Past Surgical History:  Procedure Laterality Date   NO PAST SURGERIES      Social History  reports that she has never smoked. She has never used smokeless tobacco. She reports that she does not drink alcohol and does not use drugs.  No Known Allergies  Family History  Problem Relation Age of Onset   Liver disease Brother   Reviewed on admission  Prior to Admission medications   Medication Sig Start Date End Date Taking? Authorizing Provider  acetaminophen (TYLENOL) 500 MG tablet Take 500 mg by mouth every 6 (six) hours as needed for mild pain.   Yes [provider]  acetaminophen (TYLENOL) 325 MG tablet Take 2 tablets (650 mg total) by mouth every 4 (four) hours as needed (for pain scale < 4). Patient not taking: Reported on 04/24/2021 12/08/18   Clarnce Flock, MD  Blood Pressure Monitoring (BLOOD PRESSURE KIT) DEVI 1 Device by Does not apply route once a week. ICD 10: Z34.90 Patient not taking: Reported on 04/24/2021 08/28/18   Anyanwu, Sallyanne Havers, MD  naproxen (NAPROSYN) 500 MG tablet Take 1 tablet (500 mg total) by mouth 2 (two) times daily with a meal. Patient not taking: Reported on 08/22/2021 06/01/21   Blanchie Dessert, MD  Prenatal Vit-Fe Fumarate-FA (PRENATAL VITAMIN) 27-0.8 MG TABS Take 1 tablet by mouth daily. Patient not taking: Reported on 01/11/2019 07/04/18   Manya Silvas, [place]    Physical Exam: Vitals:   08/22/21 1900 08/22/21 1915 08/22/21 2000  08/22/21 2015  BP: 106/77 112/83 102/75 102/74  Pulse: (!) 111 (!) 113 (!) 112 (!) 115  Resp: 17 18    Temp: 98.6 F (37 C)     TempSrc:      SpO2: 99% 99% 100% 100%    Physical Exam Constitutional:      General: She is not in acute distress.    Appearance: Normal appearance.  HENT:     Head: Normocephalic and atraumatic.     Mouth/Throat:     Mouth: Mucous membranes are moist.     Pharynx: Oropharynx is clear.  Eyes:     Extraocular Movements: Extraocular movements intact.     Pupils: Pupils  are equal, round, and reactive to light.  Cardiovascular:     Rate and Rhythm: Regular rhythm. Tachycardia present.     Pulses: Normal pulses.     Heart sounds: Normal heart sounds.  Pulmonary:     Effort: Pulmonary effort is normal. No respiratory distress.     Breath sounds: Rales present.  Abdominal:     General: Bowel sounds are normal. There is no distension.     Palpations: Abdomen is soft.     Tenderness: There is no abdominal tenderness.  Musculoskeletal:        General: No swelling or deformity.     Comments: Swelling of bilateral ankles  Skin:    General: Skin is warm and dry.     Findings: Rash (Palms and soles) present.  Neurological:     General: No focal deficit present.     Mental Status: Mental status is at baseline.    Labs on Admission: I have personally reviewed following labs and imaging studies  CBC: Recent Labs  Lab 08/22/21 1309  WBC 3.6*  NEUTROABS 2.8  HGB 10.9*  HCT 34.7*  MCV 87.6  PLT 841    Basic Metabolic Panel: Recent Labs  Lab 08/22/21 1309  NA 133*  K 3.7  CL 105  CO2 22  GLUCOSE 98  BUN 11  CREATININE 0.60  CALCIUM 7.9*    GFR: CrCl cannot be calculated (Unknown ideal weight.).  Liver Function Tests: Recent Labs  Lab 08/22/21 1309  AST 133*  ALT 94*  ALKPHOS 119  BILITOT 0.5  PROT 7.8  ALBUMIN 2.6*    Urine analysis:    Component Value Date/Time   COLORURINE COLORLESS (A) 08/22/2021 1545   APPEARANCEUR CLEAR 08/22/2021 1545   LABSPEC 1.001 (L) 08/22/2021 1545   PHURINE 6.0 08/22/2021 1545   GLUCOSEU NEGATIVE 08/22/2021 1545   HGBUR NEGATIVE 08/22/2021 1545   BILIRUBINUR NEGATIVE 08/22/2021 1545   KETONESUR NEGATIVE 08/22/2021 1545   PROTEINUR NEGATIVE 08/22/2021 1545   UROBILINOGEN 1.0 08/22/2021 1217   NITRITE NEGATIVE 08/22/2021 1545   LEUKOCYTESUR NEGATIVE 08/22/2021 1545    Radiological Exams on Admission: DG Chest 2 View  Result Date: 08/22/2021 CLINICAL DATA:  Chest pain, leg swelling and  tachycardia. EXAM: CHEST - 2 VIEW COMPARISON:  Chest radiograph September 25, 2007. FINDINGS: The heart size and mediastinal contours are within normal limits. No focal airspace consolidation. No pleural effusion. No pneumothorax. The visualized skeletal structures are unremarkable. IMPRESSION: No acute cardiopulmonary process. Electronically Signed   By: Dahlia Bailiff M.D.   On: 08/22/2021 15:06    EKG: Independently reviewed.  Initial EKG with sinus tachycardia 143 bpm.  Nonspecific T wave abnormality.  Repeat EKG later in the evening showed sinus tachycardia at 118 bpm.  RSR prime in V2.  Nonspecific T wave  changes.  Assessment/Plan Principal Problem:   SIRS (systemic inflammatory response syndrome) (HCC) Active Problems:   Acute hepatitis B   SIRS Acute Hepatitis B > Patient presenting with tachycardia and lower extremity edema.  Also with fevers for the past several days and rash on the palms and soles for a week.  Meets SIRS criteria with fever, tachycardia, leukopenia. > Lab work-up came back positive for hepatitis B core IgM only which would be consistent with acute hepatitis in the window phase. > Other labs checked included negative HIV, negative UDS, early ketosis and recommend spotted fever pending.  RPR pending. > Infectious disease consulted and agreed with checking tickborne illness labs and that they would consult patient if admitted. > Patient started on doxycycline while in the ED and awaiting tickborne illness results.  Negative for flu and COVID in ED. - Monitor on telemetry for now - Appreciate infectious disease recommendations - Continue with supportive care including as needed Tylenol and IV fluids - Continue doxycycline - Follow-up urine culture, blood culture, UA RPR, recommend spotted fever, early ketosis - We will likely benefit from checking hepatitis B DNA, however will hold off until formal consult from ID to avoid excessive blood draws - With rales and  leukopenia, will add on full respiratory viral panel to rule out respiratory viral involvement.  DVT prophylaxis: Lovenox Code Status:   Full Family Communication:  Updated at bedside Disposition Plan:   Patient is from:  Home  Anticipated DC to:  Home  Anticipated DC date:  1 to 3 days  Anticipated DC barriers: None  Consults called:  Infectious disease, in the ED, will see the patient tomorrow Admission status:  Observation, telemetry  Severity of Illness: The appropriate patient status for this patient is OBSERVATION. Observation status is judged to be reasonable and necessary in order to provide the required intensity of service to ensure the patient's safety. The patient's presenting symptoms, physical exam findings, and initial radiographic and laboratory data in the context of their medical condition is felt to place them at decreased risk for further clinical deterioration. Furthermore, it is anticipated that the patient will be medically stable for discharge from the hospital within 2 midnights of admission.    Marcelyn Bruins MD [facility]  How to contact the [facility] Attending or Consulting provider [place] or covering provider during after hours [place], for this patient?   Check the care team in [facility] and look for a) attending/consulting [facility] provider listed and b) the [facility] team listed Log into [email or web address] and use [place]'s universal password to access. If you do not have the password, please contact the hospital operator. Locate the [facility] provider you are looking for under [facility] and page to a number that you can be directly reached. If you still have difficulty reaching the provider, please page the [facility] (Director on Call) for the Hospitalists listed on amion for assistance.  08/22/2021, 9:32 PM

## 2021-08-22 NOTE — ED Notes (Signed)
Initial rounding on pt- she is alert/oriented x 4, resting without complaints. Family is at the bedside.

## 2021-08-23 ENCOUNTER — Inpatient Hospital Stay (HOSPITAL_COMMUNITY): Payer: Medicaid Other

## 2021-08-23 DIAGNOSIS — Z20822 Contact with and (suspected) exposure to covid-19: Secondary | ICD-10-CM | POA: Diagnosis not present

## 2021-08-23 DIAGNOSIS — R7401 Elevation of levels of liver transaminase levels: Secondary | ICD-10-CM | POA: Diagnosis not present

## 2021-08-23 DIAGNOSIS — B169 Acute hepatitis B without delta-agent and without hepatic coma: Secondary | ICD-10-CM | POA: Diagnosis not present

## 2021-08-23 DIAGNOSIS — M25471 Effusion, right ankle: Secondary | ICD-10-CM | POA: Diagnosis not present

## 2021-08-23 DIAGNOSIS — R000 Tachycardia, unspecified: Secondary | ICD-10-CM | POA: Diagnosis not present

## 2021-08-23 DIAGNOSIS — M7989 Other specified soft tissue disorders: Secondary | ICD-10-CM | POA: Diagnosis not present

## 2021-08-23 DIAGNOSIS — M25472 Effusion, left ankle: Secondary | ICD-10-CM | POA: Diagnosis not present

## 2021-08-23 DIAGNOSIS — Z79899 Other long term (current) drug therapy: Secondary | ICD-10-CM | POA: Diagnosis not present

## 2021-08-23 DIAGNOSIS — L539 Erythematous condition, unspecified: Secondary | ICD-10-CM | POA: Diagnosis not present

## 2021-08-23 DIAGNOSIS — M25572 Pain in left ankle and joints of left foot: Secondary | ICD-10-CM | POA: Diagnosis not present

## 2021-08-23 DIAGNOSIS — R21 Rash and other nonspecific skin eruption: Secondary | ICD-10-CM | POA: Diagnosis not present

## 2021-08-23 DIAGNOSIS — B1910 Unspecified viral hepatitis B without hepatic coma: Secondary | ICD-10-CM | POA: Diagnosis not present

## 2021-08-23 DIAGNOSIS — R6510 Systemic inflammatory response syndrome (SIRS) of non-infectious origin without acute organ dysfunction: Secondary | ICD-10-CM | POA: Diagnosis not present

## 2021-08-23 DIAGNOSIS — Z8615 Personal history of latent tuberculosis infection: Secondary | ICD-10-CM | POA: Diagnosis not present

## 2021-08-23 DIAGNOSIS — R079 Chest pain, unspecified: Secondary | ICD-10-CM | POA: Diagnosis not present

## 2021-08-23 DIAGNOSIS — M069 Rheumatoid arthritis, unspecified: Secondary | ICD-10-CM | POA: Diagnosis not present

## 2021-08-23 DIAGNOSIS — R509 Fever, unspecified: Secondary | ICD-10-CM | POA: Diagnosis present

## 2021-08-23 DIAGNOSIS — M25571 Pain in right ankle and joints of right foot: Secondary | ICD-10-CM | POA: Diagnosis not present

## 2021-08-23 LAB — CBC
HCT: 41.8 % (ref 36.0–46.0)
Hemoglobin: 13.2 g/dL (ref 12.0–15.0)
MCH: 27.1 pg (ref 26.0–34.0)
MCHC: 31.6 g/dL (ref 30.0–36.0)
MCV: 85.8 fL (ref 80.0–100.0)
Platelets: 235 10*3/uL (ref 150–400)
RBC: 4.87 MIL/uL (ref 3.87–5.11)
RDW: 14.8 % (ref 11.5–15.5)
WBC: 2.1 10*3/uL — ABNORMAL LOW (ref 4.0–10.5)
nRBC: 0 % (ref 0.0–0.2)

## 2021-08-23 LAB — C-REACTIVE PROTEIN: CRP: 1.4 mg/dL — ABNORMAL HIGH (ref <1.0)

## 2021-08-23 LAB — COMPREHENSIVE METABOLIC PANEL
ALT: 73 U/L — ABNORMAL HIGH (ref 0–44)
AST: 99 U/L — ABNORMAL HIGH (ref 15–41)
Albumin: 2.2 g/dL — ABNORMAL LOW (ref 3.5–5.0)
Alkaline Phosphatase: 95 U/L (ref 38–126)
Anion gap: 5 (ref 5–15)
BUN: 6 mg/dL (ref 6–20)
CO2: 21 mmol/L — ABNORMAL LOW (ref 22–32)
Calcium: 7.7 mg/dL — ABNORMAL LOW (ref 8.9–10.3)
Chloride: 113 mmol/L — ABNORMAL HIGH (ref 98–111)
Creatinine, Ser: 0.53 mg/dL (ref 0.44–1.00)
GFR, Estimated: >60 mL/min (ref >60)
Glucose, Bld: 112 mg/dL — ABNORMAL HIGH (ref 70–99)
Potassium: 3.5 mmol/L (ref 3.5–5.1)
Sodium: 139 mmol/L (ref 135–145)
Total Bilirubin: 0.4 mg/dL (ref 0.3–1.2)
Total Protein: 6.7 g/dL (ref 6.5–8.1)

## 2021-08-23 LAB — RESPIRATORY PANEL BY PCR

## 2021-08-23 LAB — URINE CULTURE

## 2021-08-23 LAB — RPR: RPR Ser Ql: NONREACTIVE

## 2021-08-23 LAB — SEDIMENTATION RATE: Sed Rate: 115 mm/hr — ABNORMAL HIGH (ref 0–22)

## 2021-08-23 MED ORDER — VANCOMYCIN HCL IN DEXTROSE 1-5 GM/200ML-% IV SOLN
1000.0000 mg | Freq: Once | INTRAVENOUS | Status: AC
  Administered 2021-08-23: 1000 mg via INTRAVENOUS
  Filled 2021-08-23: qty 200

## 2021-08-23 MED ORDER — LACTATED RINGERS IV SOLN
INTRAVENOUS | Status: DC
  Administered 2021-08-24: 900 mL via INTRAVENOUS

## 2021-08-23 MED ORDER — VANCOMYCIN HCL IN DEXTROSE 1-5 GM/200ML-% IV SOLN: 1000.0000 mg | INTRAVENOUS | Status: DC

## 2021-08-23 NOTE — Progress Notes (Signed)
Called by RN re elevated temp and HR 130s- temp only 100.1 so suspect other factors contributing such as dehydration. LR ordered at 100/hr

## 2021-08-23 NOTE — ED Notes (Signed)
Report given to Alex, RN. All questions answered.

## 2021-08-23 NOTE — Progress Notes (Signed)
Patient is transported from ED,alert and oriented,no c/o of pain,walked to the bed with no difficulty,patient made comfortable in room,assessment done,patient stated she is anxious about  outcome of her diagnosis and her children,patient has a yellow MEWS and MEWS protocol in place.

## 2021-08-23 NOTE — Progress Notes (Signed)
PROGRESS NOTE    Victoria Fitzpatrick  FAO:[id number] DOB: 1990/08/03 DOA: 08/22/2021 PCP: Lindaann Pascal, PA-C    Brief Narrative:  Victoria Fitzpatrick is a 31 y.o. female with medical history significant of rheumatoid arthritis presenting with tachycardia, fever, edema. Patient reports ongoing ankle edema for the past month.  She began to have pain in her ankle today so she was evaluated in urgent care while there she was found to have fever rash and tachycardia and she was into the ED for further evaluation. She was reporting rash on her palms and soles for about a week and fever for the past several days around 100 Fahrenheit at home.   ID consulted  Assessment and Plan: SIRS ?Acute Hepatitis B > Patient presenting with tachycardia and lower extremity edema.  Also with fevers for the past several days and rash on the palms and soles for a week.  Meets SIRS criteria with fever, tachycardia, leukopenia. > Lab work-up came back positive for hepatitis B core IgM only which would be consistent with acute hepatitis in the window phase. -ID consulted -abd U/S pending -multiple labs pending -abx per ID -RPR negative  DVT prophylaxis: enoxaparin (LOVENOX) injection 30 mg Start: 08/22/21 2200    Code Status: Full Code Family Communication:   Disposition Plan:  Level of care: Telemetry Medical Status is: Inpatient Remains inpatient appropriate because: ID work up in process    Consultants:  ID   Subjective: No SOB, asking about going home  Objective: Vitals:   08/23/21 0216 08/23/21 0614 08/23/21 0929 08/23/21 1200  BP: 109/73 106/75 106/77 109/65  Pulse: (!) 120 (!) 131 (!) 131 (!) 121  Resp: 18 17 17 15   Temp: 99.8 F (37.7 C) 99.4 F (37.4 C) 99.6 F (37.6 C) 99.3 F (37.4 C)  TempSrc: Oral Oral Oral Oral  SpO2: 100% 98% 100% 98%    Intake/Output Summary (Last 24 hours) at 08/23/2021 1246 Last data filed at 08/23/2021 1053 Gross per 24 hour  Intake 2450.14 ml  Output --  Net  2450.14 ml   There were no vitals filed for this visit.  Examination:   General: Appearance:    Well developed, well nourished female in no acute distress     Lungs:     respirations unlabored  Heart:    Tachycardic. Normal rhythm. No murmurs, rubs, or gallops.    MS:   All extremities are intact. Rashes on palms, toes   Neurologic:   Awake, alert       Data Reviewed: I have personally reviewed following labs and imaging studies  CBC: Recent Labs  Lab 08/22/21 1309 08/23/21 0048  WBC 3.6* 2.1*  NEUTROABS 2.8  --   HGB 10.9* 13.2  HCT 34.7* 41.8  MCV 87.6 85.8  PLT 308 235   Basic Metabolic Panel: Recent Labs  Lab 08/22/21 1309 08/23/21 0048  NA 133* 139  K 3.7 3.5  CL 105 113*  CO2 22 21*  GLUCOSE 98 112*  BUN 11 6  CREATININE 0.60 0.53  CALCIUM 7.9* 7.7*   GFR: CrCl cannot be calculated (Unknown ideal weight.). Liver Function Tests: Recent Labs  Lab 08/22/21 1309 08/23/21 0048  AST 133* 99*  ALT 94* 73*  ALKPHOS 119 95  BILITOT 0.5 0.4  PROT 7.8 6.7  ALBUMIN 2.6* 2.2*   No results for input(s): "LIPASE", "AMYLASE" in the last 168 hours. No results for input(s): "AMMONIA" in the last 168 hours. Coagulation Profile: Recent Labs  Lab 08/22/21 1525  INR 0.9   Cardiac Enzymes: No results for input(s): "CKTOTAL", "CKMB", "CKMBINDEX", "TROPONINI" in the last 168 hours. BNP (last 3 results) No results for input(s): "PROBNP" in the last 8760 hours. HbA1C: No results for input(s): "HGBA1C" in the last 72 hours. CBG: No results for input(s): "GLUCAP" in the last 168 hours. Lipid Profile: No results for input(s): "CHOL", "HDL", "LDLCALC", "TRIG", "CHOLHDL", "LDLDIRECT" in the last 72 hours. Thyroid Function Tests: Recent Labs    08/22/21 1525  TSH 2.162   Anemia Panel: No results for input(s): "VITAMINB12", "FOLATE", "FERRITIN", "TIBC", "IRON", "RETICCTPCT" in the last 72 hours. Sepsis Labs: Recent Labs  Lab 08/22/21 1525  LATICACIDVEN  1.0    Recent Results (from the past 240 hour(s))  Blood culture (routine x 2)     Status: None (Preliminary result)   Collection Time: 08/22/21  2:11 PM   Specimen: BLOOD RIGHT FOREARM  Result Value Ref Range Status   Specimen Description BLOOD RIGHT FOREARM  Final   Special Requests   Final    BOTTLES DRAWN AEROBIC AND ANAEROBIC Blood Culture adequate volume   Culture   Final    NO GROWTH < 24 HOURS Performed at [facility], [address], [place], [place] [address]    Report Status PENDING  Incomplete  Resp Panel by RT-PCR (Flu A&B, Covid) Anterior Nasal Swab     Status: None   Collection Time: 08/22/21  2:12 PM   Specimen: Anterior Nasal Swab  Result Value Ref Range Status   SARS Coronavirus 2 by RT PCR NEGATIVE NEGATIVE Final    Comment: (NOTE) SARS-CoV-2 target nucleic acids are NOT DETECTED.  The SARS-CoV-2 RNA is generally detectable in upper respiratory specimens during the acute phase of infection. The lowest concentration of SARS-CoV-2 viral copies this assay can detect is 138 copies/mL. A negative result does not preclude SARS-Cov-2 infection and should not be used as the sole basis for treatment or other patient management decisions. A negative result may occur with  improper specimen collection/handling, submission of specimen other than nasopharyngeal swab, presence of viral mutation(s) within the areas targeted by this assay, and inadequate number of viral copies(<138 copies/mL). A negative result must be combined with clinical observations, patient history, and epidemiological information. The expected result is Negative.  Fact Sheet for Patients:  BloggerCourse.com  Fact Sheet for Healthcare Providers:  SeriousBroker.it  This test is no t yet approved or cleared by the [place] FDA and  has been authorized for detection and/or diagnosis of SARS-CoV-2 by FDA under an Emergency Use Authorization  (EUA). This EUA will remain  in effect (meaning this test can be used) for the duration of the COVID-19 declaration under Section 564(b)(1) of the Act, 21 U.S.C.section 360bbb-3(b)(1), unless the authorization is terminated  or revoked sooner.       Influenza A by PCR NEGATIVE NEGATIVE Final   Influenza B by PCR NEGATIVE NEGATIVE Final    Comment: (NOTE) The Xpert Xpress SARS-CoV-2/FLU/RSV plus assay is intended as an aid in the diagnosis of influenza from Nasopharyngeal swab specimens and should not be used as a sole basis for treatment. Nasal washings and aspirates are unacceptable for Xpert Xpress SARS-CoV-2/FLU/RSV testing.  Fact Sheet for Patients: BloggerCourse.com  Fact Sheet for Healthcare Providers: SeriousBroker.it  This test is not yet approved or cleared by the [place] FDA and has been authorized for detection and/or diagnosis of SARS-CoV-2 by FDA under an Emergency Use Authorization (EUA). This EUA will remain in effect (  meaning this test can be used) for the duration of the COVID-19 declaration under Section 564(b)(1) of the Act, 21 U.S.C. section 360bbb-3(b)(1), unless the authorization is terminated or revoked.  Performed at [facility], [address] [place], [place] [address]   Respiratory (~20 pathogens) panel by PCR     Status: None   Collection Time: 08/22/21  2:12 PM   Specimen: Nasopharyngeal Swab; Respiratory  Result Value Ref Range Status   Adenovirus NOT DETECTED NOT DETECTED Final   Coronavirus 229E NOT DETECTED NOT DETECTED Final    Comment: (NOTE) The Coronavirus on the Respiratory Panel, DOES NOT test for the novel  Coronavirus (2019 nCoV)    Coronavirus HKU1 NOT DETECTED NOT DETECTED Final   Coronavirus NL63 NOT DETECTED NOT DETECTED Final   Coronavirus OC43 NOT DETECTED NOT DETECTED Final   Metapneumovirus NOT DETECTED NOT DETECTED Final   Rhinovirus / Enterovirus NOT  DETECTED NOT DETECTED Final   Influenza A NOT DETECTED NOT DETECTED Final   Influenza B NOT DETECTED NOT DETECTED Final   Parainfluenza Virus 1 NOT DETECTED NOT DETECTED Final   Parainfluenza Virus 2 NOT DETECTED NOT DETECTED Final   Parainfluenza Virus 3 NOT DETECTED NOT DETECTED Final   Parainfluenza Virus 4 NOT DETECTED NOT DETECTED Final   Respiratory Syncytial Virus NOT DETECTED NOT DETECTED Final   Bordetella pertussis NOT DETECTED NOT DETECTED Final   Bordetella Parapertussis NOT DETECTED NOT DETECTED Final   Chlamydophila pneumoniae NOT DETECTED NOT DETECTED Final   Mycoplasma pneumoniae NOT DETECTED NOT DETECTED Final    Comment: Performed at [facility], [address], [place], [place] [address]  Blood culture (routine x 2)     Status: None (Preliminary result)   Collection Time: 08/22/21  2:16 PM   Specimen: BLOOD  Result Value Ref Range Status   Specimen Description BLOOD LEFT ANTECUBITAL  Final   Special Requests   Final    BOTTLES DRAWN AEROBIC AND ANAEROBIC Blood Culture adequate volume   Culture   Final    NO GROWTH < 24 HOURS Performed at [facility], [address] [place], [place] [address]    Report Status PENDING  Incomplete         Radiology Studies: DG Chest 2 View  Result Date: 08/22/2021 CLINICAL DATA:  Chest pain, leg swelling and tachycardia. EXAM: CHEST - 2 VIEW COMPARISON:  Chest radiograph September 25, 2007. FINDINGS: The heart size and mediastinal contours are within normal limits. No focal airspace consolidation. No pleural effusion. No pneumothorax. The visualized skeletal structures are unremarkable. IMPRESSION: No acute cardiopulmonary process. Electronically Signed   By: Maudry Mayhew M.D.   On: 08/22/2021 15:06        Scheduled Meds:  enoxaparin (LOVENOX) injection  30 mg Subcutaneous Q24H   sodium chloride flush  3 mL Intravenous Q12H   Continuous Infusions:  lactated ringers 100 mL/hr at 08/23/21 1132     LOS: 0  days    Time spent: 45 minutes spent on chart review, discussion with nursing staff, consultants, updating family and interview/physical exam; more than 50% of that time was spent in counseling and/or coordination of care.    Joseph Art, DO [facility] Available via Epic secure chat 7am-7pm After these hours, please refer to coverage provider listed on [email or web address] 08/23/2021, 12:46 PM

## 2021-08-23 NOTE — Consult Note (Addendum)
[facility]                                                                                        Patient Identification: Patient Name: Victoria Fitzpatrick MRN: [id number] Admit Date: 08/22/2021 12:57 PM Today's Date: 08/23/2021 Reason for consult: Fever, rashes, Hepatitis B  Requesting provider: Karin Lieu cooper   Principal Problem:   SIRS (systemic inflammatory response syndrome) (HCC) Active Problems:   Acute hepatitis B   Antibiotics:  Doxycycline  Vancomycin   Lines/Hardware: PIV  Assessment 31 Y O female from [place]  with RA not on medications, latent TB s/p tx  who presented to the ED 8/12 with bilateral ankle swelling for a month, rashes in bilateral palms and soles for a weeks and fever for 3 days. Labs with transaminitis and elevated Hepatitis B anti IgM, possible acute hepatitis B in window phase with concerns for serum like sickness.   ID work up so far 8/12 Influenza A, B SarsCov2 PCR negative 8/12 RPR negative 8/12 HIV NR, HCV ab NR, Hepatitis B surface ag NR, Hep B sore Ab IgM reactive    Recommendations  Reasonable to continue doxycyline for now pending labs although no tick exposures ( mild transaminitis, leukopenia)  Vancomycin, pharmacy to dose, pending blood cultures   Multiple labs sent from ED  pending, will follow ( RMSF, Ehrlichia, RVP) Will order hepatitis B full panel including US abdomen. I- stat hCG negative. No indication for anti - viral therapy at this time ESR, CRP, RF, ANA Urine GC, Lyme serology  Ch50, C3 and C4, concerns for serum like sickness Bilateral Xray of ankles  Follow CMP   Rest of the management as per the primary team. Please call with questions or concerns.  Thank you for the consult  Odette Fraction, MD Infectious Disease Physician [facility] [address] [place], [place]  [address] Phone: [phone]  Fax: [phone]  __________________________________________________________________________________________________________ HPI and Hospital Course: 31 Y O female from [place]  with RA not on medications, latent TB s/p tx who presented to the ED 8/12 with bilateral ankle swelling for a month, rashes in bilateral palms and soles for a weeks and fever for 3 days.  History obtained from patient as well as chart review.   Started having b/l ankle swelling a month ago. Tells she had gone for fishing at [facility] prior to ankle swelling. She really did not pay attention to it much as it was not hurting but later started having soreness in her b/l ankles in the last few days + subjective fevers and hence, went to UC on Friday and was sent to ED as he was febrile and tachycardic.  Denies chills and sweats.  Has scattered patches of erythematous rash in the palms of bilateral hands and feet + bilateral shin. Erythematous rash in her rt cheek that started recently where she picked Denies headache, neck pain, back pain, blurry vision. Denies sore throat, runny nose, cough.  Denies chest pain, SOB. Denies nausea, vomiting, abdominal pain and diarrhea.  Denies recent travel, sick contacts Denies prior h/o  hepatitis B   Diagnosed with RA a year ago, saw rheumatologist but not started on medications as thought to be early. Used to take olive for joint pain with minimal relief and stopped  Moved from [place] to [place] in 2002, Screened positive for latent TB that time and completed 6 months course of tx through [facility] per her report. Lives with husband ( works in a power pressure job) and 2 children ( 63, 7 and 2). She works in a Company secretary. Denies smoking, alcohol and IVDU. Sexually active with husband. Regular Menstrual cycle, last LMP 08/14/21  ROS: all systems reviewed with pertinent positives and negatives as listed above  Past Medical History:  Diagnosis Date    Medical history non-contributory    Rheumatoid arthritis ([place])     Past Surgical History:  Procedure Laterality Date   NO PAST SURGERIES     ' Scheduled Meds:  enoxaparin (LOVENOX) injection  30 mg Subcutaneous Q24H   sodium chloride flush  3 mL Intravenous Q12H   Continuous Infusions:  lactated ringers 100 mL/hr at 08/23/21 0122   PRN Meds:.acetaminophen **OR** acetaminophen, polyethylene glycol  No Known Allergies  Social History   Socioeconomic History   Marital status: Married    Spouse name: Duinh   Number of children: 2   Years of education: Not on file   Highest education level: Not on file  Occupational History   Not on file  Tobacco Use   Smoking status: Never   Smokeless tobacco: Never  Vaping Use   Vaping Use: Never used  Substance and Sexual Activity   Alcohol use: No   Drug use: No   Sexual activity: Yes    Birth control/protection: None  Other Topics Concern   Not on file  Social History Narrative   Not on file   Social Determinants of Health   Financial Resource Strain: Low Risk  (12/06/2018)   Overall Financial Resource Strain (CARDIA)    Difficulty of Paying Living Expenses: Not hard at all  Food Insecurity: No Food Insecurity (06/16/2018)   Hunger Vital Sign    Worried About Running Out of Food in the Last Year: Never true    Ran Out of Food in the Last Year: Never true  Transportation Needs: No Transportation Needs (06/16/2018)   PRAPARE - Hydrologist (Medical): No    Lack of Transportation (Non-Medical): No  Physical Activity: Not on file  Stress: Not on file  Social Connections: Not on file  Intimate Partner Violence: Not on file   Family History  Problem Relation Age of Onset   Liver disease Brother     Vitals BP 106/75 (BP Location: Left Arm)   Pulse (!) 131   Temp 99.4 F (37.4 C) (Oral)   Resp 17   SpO2 98%   Physical Exam Constitutional:  sitting up in the bed, not in acute distress     Comments:   Cardiovascular:     Rate and Rhythm: Normal rate and regular rhythm.     Heart sounds: Tachycardic   Pulmonary:     Effort: Pulmonary effort is normal on room air     Comments: Normal breath sounds   Abdominal:     Palpations: Abdomen is soft.     Tenderness: non distended and non tender   Musculoskeletal:        General: No swelling or tenderness.   Skin:    Comments:  Bilateral ankle swollen with no signs of septic joint. ROM of bilateral ankle good   Neurological:     General: No focal deficit present. Awake, alert and oriented   Psychiatric:        Mood and Affect: Mood normal.    Pertinent Microbiology Results for orders placed or performed during the hospital encounter of 08/22/21  Resp Panel by RT-PCR (Flu A&B, Covid) Anterior Nasal Swab     Status: None   Collection Time: 08/22/21  2:12 PM   Specimen: Anterior Nasal Swab  Result Value Ref Range Status   SARS Coronavirus 2 by RT PCR NEGATIVE NEGATIVE Final    Comment: (NOTE) SARS-CoV-2 target nucleic acids are NOT DETECTED.  The SARS-CoV-2 RNA is generally detectable in upper respiratory specimens during the acute phase of infection. The lowest concentration of SARS-CoV-2 viral copies this assay can detect is 138 copies/mL. A negative result does not preclude SARS-Cov-2 infection and should not be used as the sole basis for treatment or other patient management decisions. A negative result may occur with  improper specimen collection/handling, submission of specimen other than nasopharyngeal swab, presence of viral mutation(s) within the areas targeted by this assay, and inadequate number of viral copies(<138 copies/mL). A negative result must be combined with clinical observations, patient history, and epidemiological information. The expected result is Negative.  Fact Sheet for Patients:  EntrepreneurPulse.com.au  Fact Sheet for Healthcare Providers:   IncredibleEmployment.be  This test is no t yet approved or cleared by the [place] FDA and  has been authorized for detection and/or diagnosis of SARS-CoV-2 by FDA under an Emergency Use Authorization (EUA). This EUA will remain  in effect (meaning this test can be used) for the duration of the COVID-19 declaration under Section 564(b)(1) of the Act, 21 U.S.C.section 360bbb-3(b)(1), unless the authorization is terminated  or revoked sooner.       Influenza A by PCR NEGATIVE NEGATIVE Final   Influenza B by PCR NEGATIVE NEGATIVE Final    Comment: (NOTE) The Xpert Xpress SARS-CoV-2/FLU/RSV plus assay is intended as an aid in the diagnosis of influenza from Nasopharyngeal swab specimens and should not be used as a sole basis for treatment. Nasal washings and aspirates are unacceptable for Xpert Xpress SARS-CoV-2/FLU/RSV testing.  Fact Sheet for Patients: EntrepreneurPulse.com.au  Fact Sheet for Healthcare Providers: IncredibleEmployment.be  This test is not yet approved or cleared by the [place] FDA and has been authorized for detection and/or diagnosis of SARS-CoV-2 by FDA under an Emergency Use Authorization (EUA). This EUA will remain in effect (meaning this test can be used) for the duration of the COVID-19 declaration under Section 564(b)(1) of the Act, 21 U.S.C. section 360bbb-3(b)(1), unless the authorization is terminated or revoked.  Performed at [facility], [place] [address], [place], [place] [address]     Pertinent Lab seen by me:    Latest Ref Rng & Units 08/23/2021   12:48 AM 08/22/2021    1:09 PM 06/01/2021    8:42 AM  CBC  WBC 4.0 - 10.5 K/uL 2.1  3.6  4.1   Hemoglobin 12.0 - 15.0 g/dL 13.2  10.9  11.1   Hematocrit 36.0 - 46.0 % 41.8  34.7  35.5   Platelets 150 - 400 K/uL 235  308  308       Latest Ref Rng & Units 08/23/2021   12:48 AM 08/22/2021    1:09 PM 06/01/2021    8:42 AM  CMP   Glucose 70 -  99 mg/dL 112  98  96   BUN 6 - 20 mg/dL _0 Creatinine 0.44 - 1.00 mg/dL 0.53  0.60  0.58   Sodium 135 - 145 mmol/L 139  133  136   Potassium 3.5 - 5.1 mmol/L 3.5  3.7  3.3   Chloride 98 - 111 mmol/L 113  105  107   CO2 22 - 32 mmol/L _1 Calcium 8.9 - 10.3 mg/dL 7.7  7.9  8.6   Total Protein 6.5 - 8.1 g/dL 6.7  7.8    Total Bilirubin 0.3 - 1.2 mg/dL 0.4  0.5    Alkaline Phos 38 - 126 U/L 95  119    AST 15 - 41 U/L 99  133    ALT 0 - 44 U/L 73  94       Pertinent Imagings/Other Imagings Plain films and CT images have been personally visualized and interpreted; radiology reports have been reviewed. Decision making incorporated into the Impression / Recommendations.  DG Chest 2 View  Result Date: 08/22/2021 CLINICAL DATA:  Chest pain, leg swelling and tachycardia. EXAM: CHEST - 2 VIEW COMPARISON:  Chest radiograph September 25, 2007. FINDINGS: The heart size and mediastinal contours are within normal limits. No focal airspace consolidation. No pleural effusion. No pneumothorax. The visualized skeletal structures are unremarkable. IMPRESSION: No acute cardiopulmonary process. Electronically Signed   By: Dahlia Bailiff M.D.   On: 08/22/2021 15:06    I spent 110 minutes for this patient encounter including review of prior medical records/discussing diagnostics and treatment plan with the patient/family/coordinate care with primary/other specialits with greater than 50% of time in face to face encounter.   Electronically signed by:   Rosiland Oz, MD Infectious Disease Physician [facility] Infectious [facility] Pager: [phone]

## 2021-08-23 NOTE — Progress Notes (Signed)
Pharmacy Antibiotic Note  Victoria Fitzpatrick is a 31 y.o. female admitted on 08/22/2021 with cellulitis/ongoing rash on palms and feet soles, sepsis.   Pharmacy has been consulted for Vancomycin dosing.  Plan: Vancomycin 1 gram iv Q 24 hours (AUC of 466 using Scr 0.8)  Follow Scr, cultures, ID plan  Weight: 48.1 kg (106 lb)  Temp (24hrs), Avg:99.4 F (37.4 C), Min:98.4 F (36.9 C), Max:100.1 F (37.8 C)  Recent Labs  Lab 08/22/21 1309 08/22/21 1525 08/23/21 0048  WBC 3.6*  --  2.1*  CREATININE 0.60  --  0.53  LATICACIDVEN  --  1.0  --     Estimated Creatinine Clearance: 69.5 mL/min (by C-G formula based on SCr of 0.53 mg/dL).    No Known Allergies  Thank you for allowing pharmacy to be a part of this patient's care. Okey Regal, PharmD  08/23/2021 7:18 PM

## 2021-08-23 NOTE — Progress Notes (Signed)
Patient has a yellow MEWS score  due to temp 100.1 and pulse of 130,on-call provider informed and new orders given,will continue to monitor.

## 2021-08-24 DIAGNOSIS — R21 Rash and other nonspecific skin eruption: Secondary | ICD-10-CM

## 2021-08-24 DIAGNOSIS — B169 Acute hepatitis B without delta-agent and without hepatic coma: Secondary | ICD-10-CM | POA: Diagnosis not present

## 2021-08-24 DIAGNOSIS — R6510 Systemic inflammatory response syndrome (SIRS) of non-infectious origin without acute organ dysfunction: Secondary | ICD-10-CM | POA: Diagnosis not present

## 2021-08-24 LAB — LYME DISEASE SEROLOGY W/REFLEX: Lyme Total Antibody EIA: NEGATIVE

## 2021-08-24 LAB — GC/CHLAMYDIA PROBE AMP (~~LOC~~) NOT AT ARMC
Chlamydia: NEGATIVE
Comment: NEGATIVE
Comment: NORMAL
Neisseria Gonorrhea: NEGATIVE

## 2021-08-24 LAB — RHEUMATOID FACTOR: Rheumatoid fact SerPl-aCnc: 320.9 IU/mL — ABNORMAL HIGH (ref <14.0)

## 2021-08-24 LAB — HEPATITIS B SURFACE ANTIGEN: Hepatitis B Surface Ag: NONREACTIVE

## 2021-08-24 LAB — HEPATITIS B CORE ANTIBODY, TOTAL: Hep B Core Total Ab: NONREACTIVE

## 2021-08-24 LAB — ANA: Anti Nuclear Antibody (ANA): POSITIVE — AB

## 2021-08-24 MED ORDER — LACTATED RINGERS IV BOLUS
500.0000 mL | Freq: Once | INTRAVENOUS | Status: AC
  Administered 2021-08-24: 500 mL via INTRAVENOUS

## 2021-08-24 NOTE — Progress Notes (Signed)
Patient has  ,vitals taken at 0038 was 100.4,pulse 143,RR 16,BP 99/49 map 64,left hand  showing a red MEWS and rechecked on the right hand was 99.6, Bp 100/61 map 74,RR 17,pulse 129,O2SAT 98% on room air showing a yeLlow MEWS,she is anxious ,and warm intermittently,On- call provider informed and new orders given. Will continue to monitor

## 2021-08-24 NOTE — Progress Notes (Signed)
[facility]  Date of Admission:  08/22/2021       Abx: 8/13-14 vanc  8/12 doxy  ASSESSMENT: Rash Serologic testing with positive hep b core igm  Agree she might have had acute hep b presentation. The core igm could also be false positive and she might have had nonspecific viral illness. 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   PLAN: Stop abx Ok to discharge from id standpoint I have made her id clinic appointment f/u on 9/01 to repeat testing and repeat evaluation Discussed with Dr Benjamine Mola  I spent more than 35 minute reviewing data/chart, and coordinating care and >50% direct face to face time providing counseling/discussing diagnostics/treatment plan with patient   Principal Problem:   SIRS (systemic inflammatory response syndrome) (HCC) Active Problems:   Acute hepatitis B   Fever   Rash   No Known Allergies  Scheduled Meds:  enoxaparin (LOVENOX) injection  30 mg Subcutaneous Q24H   sodium chloride flush  3 mL Intravenous Q12H   Continuous Infusions:  lactated ringers 900 mL (08/24/21 1232)   PRN Meds:.acetaminophen **OR** acetaminophen, polyethylene glycol   SUBJECTIVE: Felt well Improved bilateral ankle swelling Rash stable No other concern   Review of Systems: ROS All other ROS was negative, except mentioned above     OBJECTIVE: Vitals:   08/24/21 0557 08/24/21 0736 08/24/21 0933 08/24/21 1532  BP: 105/76  117/81 118/70  Pulse: (!) 110  (!) 106 (!) 111  Resp: 18  17 18   Temp: 98.4 F (36.9 C)  (!) 97.5 F (36.4 C) 99.1 F (37.3 C)  TempSrc: Oral  Oral Oral  SpO2: 99% 99% 99% 99%  Weight:       Body mass index is 21.41 kg/m.  Physical Exam General/constitutional: no distress, pleasant HEENT: Normocephalic,  PER, Conj Clear, EOMI, Oropharynx clear Neck supple CV: rrr no mrg Lungs: clear to auscultation, normal respiratory effort Abd: Soft, Nontender Ext: no edema Skin: maculoerythematous rash on bilateral lower legs and palms Neuro: nonfocal MSK: swelling bilateral ankle without disturbed or tender range of motion   Lab Results Lab Results  Component Value Date   WBC 2.1 (L) 08/23/2021   HGB 13.2 08/23/2021   HCT 41.8 08/23/2021   MCV 85.8 08/23/2021   PLT 235 08/23/2021    Lab Results  Component Value Date   CREATININE 0.53 08/23/2021   BUN 6 08/23/2021   NA 139 08/23/2021   K 3.5 08/23/2021   CL 113 (H) 08/23/2021   CO2 21 (L) 08/23/2021    Lab Results  Component Value Date   ALT 73 (H) 08/23/2021   AST 99 (H) 08/23/2021   ALKPHOS 95 08/23/2021   BILITOT 0.4 08/23/2021      Microbiology: Recent Results (from the past 240 hour(s))  Blood culture (routine x 2)     Status: None (Preliminary result)   Collection Time: 08/22/21  2:11 PM   Specimen: BLOOD RIGHT FOREARM  Result Value Ref Range Status   Specimen Description BLOOD RIGHT FOREARM  Final   Special Requests   Final    BOTTLES DRAWN AEROBIC AND ANAEROBIC Blood Culture adequate volume   Culture   Final    NO GROWTH 2 DAYS Performed  at [facility], [address] [place], [place] [address]    Report Status PENDING  Incomplete  Resp Panel by RT-PCR (Flu A&B, Covid) Anterior Nasal Swab     Status: None   Collection Time: 08/22/21  2:12 PM   Specimen: Anterior Nasal Swab  Result Value Ref Range Status   SARS Coronavirus 2 by RT PCR NEGATIVE NEGATIVE Final    Comment: (NOTE) SARS-CoV-2 target nucleic acids are NOT DETECTED.  The SARS-CoV-2 RNA is generally detectable in upper respiratory specimens during the acute phase of infection. The lowest concentration of SARS-CoV-2 viral copies this assay can detect is 138 copies/mL. A negative result does not preclude SARS-Cov-2 infection and should not be  used as the sole basis for treatment or other patient management decisions. A negative result may occur with  improper specimen collection/handling, submission of specimen other than nasopharyngeal swab, presence of viral mutation(s) within the areas targeted by this assay, and inadequate number of viral copies(<138 copies/mL). A negative result must be combined with clinical observations, patient history, and epidemiological information. The expected result is Negative.  Fact Sheet for Patients:  BloggerCourse.com  Fact Sheet for Healthcare Providers:  SeriousBroker.it  This test is no t yet approved or cleared by the [place] FDA and  has been authorized for detection and/or diagnosis of SARS-CoV-2 by FDA under an Emergency Use Authorization (EUA). This EUA will remain  in effect (meaning this test can be used) for the duration of the COVID-19 declaration under Section 564(b)(1) of the Act, 21 U.S.C.section 360bbb-3(b)(1), unless the authorization is terminated  or revoked sooner.       Influenza A by PCR NEGATIVE NEGATIVE Final   Influenza B by PCR NEGATIVE NEGATIVE Final    Comment: (NOTE) The Xpert Xpress SARS-CoV-2/FLU/RSV plus assay is intended as an aid in the diagnosis of influenza from Nasopharyngeal swab specimens and should not be used as a sole basis for treatment. Nasal washings and aspirates are unacceptable for Xpert Xpress SARS-CoV-2/FLU/RSV testing.  Fact Sheet for Patients: BloggerCourse.com  Fact Sheet for Healthcare Providers: SeriousBroker.it  This test is not yet approved or cleared by the [place] FDA and has been authorized for detection and/or diagnosis of SARS-CoV-2 by FDA under an Emergency Use Authorization (EUA). This EUA will remain in effect (meaning this test can be used) for the duration of the COVID-19 declaration under Section  564(b)(1) of the Act, 21 U.S.C. section 360bbb-3(b)(1), unless the authorization is terminated or revoked.  Performed at [facility], [address], [place], [place] [address]   Urine Culture     Status: Abnormal   Collection Time: 08/22/21  2:12 PM   Specimen: In/Out Cath Urine  Result Value Ref Range Status   Specimen Description IN/OUT CATH URINE  Final   Special Requests   Final    NONE Performed at [facility], [address], [place], [place] [address]    Culture MULTIPLE SPECIES PRESENT, SUGGEST RECOLLECTION (A)  Final   Report Status 08/23/2021 FINAL  Final  Respiratory (~20 pathogens) panel by PCR     Status: None   Collection Time: 08/22/21  2:12 PM   Specimen: Nasopharyngeal Swab; Respiratory  Result Value Ref Range Status   Adenovirus NOT DETECTED NOT DETECTED Final   Coronavirus 229E NOT DETECTED NOT DETECTED Final    Comment: (NOTE) The Coronavirus on the Respiratory Panel, DOES NOT test for the novel  Coronavirus (2019 nCoV)    Coronavirus  HKU1 NOT DETECTED NOT DETECTED Final   Coronavirus NL63 NOT DETECTED NOT DETECTED Final   Coronavirus OC43 NOT DETECTED NOT DETECTED Final   Metapneumovirus NOT DETECTED NOT DETECTED Final   Rhinovirus / Enterovirus NOT DETECTED NOT DETECTED Final   Influenza A NOT DETECTED NOT DETECTED Final   Influenza B NOT DETECTED NOT DETECTED Final   Parainfluenza Virus 1 NOT DETECTED NOT DETECTED Final   Parainfluenza Virus 2 NOT DETECTED NOT DETECTED Final   Parainfluenza Virus 3 NOT DETECTED NOT DETECTED Final   Parainfluenza Virus 4 NOT DETECTED NOT DETECTED Final   Respiratory Syncytial Virus NOT DETECTED NOT DETECTED Final   Bordetella pertussis NOT DETECTED NOT DETECTED Final   Bordetella Parapertussis NOT DETECTED NOT DETECTED Final   Chlamydophila pneumoniae NOT DETECTED NOT DETECTED Final   Mycoplasma pneumoniae NOT DETECTED NOT DETECTED Final    Comment: Performed at [facility], [address]  [place], [place] [address]  Blood culture (routine x 2)     Status: None (Preliminary result)   Collection Time: 08/22/21  2:16 PM   Specimen: BLOOD  Result Value Ref Range Status   Specimen Description BLOOD LEFT ANTECUBITAL  Final   Special Requests   Final    BOTTLES DRAWN AEROBIC AND ANAEROBIC Blood Culture adequate volume   Culture   Final    NO GROWTH 2 DAYS Performed at [facility], [address], [place], [place] [address]    Report Status PENDING  Incomplete     Serology:   Imaging: If present, new imagings (plain films, ct scans, and mri) have been personally visualized and interpreted; radiology reports have been reviewed. Decision making incorporated into the Impression / Recommendations.   Raymondo Band, MD [facility] [phone] pager    08/24/2021, 4:08 PM

## 2021-08-24 NOTE — Discharge Summary (Signed)
    Physician Discharge Summary  Victoria Fitzpatrick MRN:[id number] DOB: 07/18/1990 DOA: 08/22/2021  PCP: Long, Scott, PA-C  Admit date: 08/22/2021 Discharge date: 08/24/2021  Admitted From: home Discharge disposition: home   Recommendations for Outpatient Follow-Up:   Close follow up with ID and rheum   Discharge Diagnosis:   Principal Problem:   SIRS (systemic inflammatory response syndrome) (HCC) Active Problems:   Acute hepatitis B   Fever   Rash    Discharge Condition: Improved.  Diet recommendation:  Regular.  Wound care: None.  Code status: Full.   History of Present Illness:   Victoria Fitzpatrick is a 30 y.o. female with medical history significant of rheumatoid arthritis presenting with tachycardia, fever, edema.   Patient reports ongoing ankle edema for the past month.  She began to have pain in her ankle today so she was evaluated in urgent care while there she was found to have fever rash and tachycardia and she was into the ED for further evaluation.   She was reporting rash on her palms and soles for about a week and fever for the past several days around 100 Fahrenheit at home.  Denies any sick contacts.  Denies any known tick bites.   Denies chills, chest pain, shortness of breath, abdominal pain, constipation, diarrhea, nausea, vomiting   Hospital Course by Problem:  SIRS Rash Serologic testing with positive hep b core igm  - PER ID: Agree she might have had acute hep b presentation. The core igm could also be false positive and she might have had nonspecific viral illness. 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  Stop abx  id clinic appointment f/u on 9/01 to repeat testing and repeat evaluation abd U/S normal   Medical Consultants:   ID   Discharge Exam:   Vitals:    08/24/21 0933 08/24/21 1532  BP: 117/81 118/70  Pulse: (!) 106 (!) 111  Resp: 17 18  Temp: (!) 97.5 F (36.4 C) 99.1 F (37.3 C)  SpO2: 99% 99%   Vitals:   08/24/21 0557 08/24/21 0736 08/24/21 0933 08/24/21 1532  BP: 105/76  117/81 118/70  Pulse: (!) 110  (!) 106 (!) 111  Resp: 18  17 18  Temp: 98.4 F (36.9 C)  (!) 97.5 F (36.4 C) 99.1 F (37.3 C)  TempSrc: Oral  Oral Oral  SpO2: 99% 99% 99% 99%  Weight:        General exam: Appears calm and comfortable.     The results of significant diagnostics from this hospitalization (including imaging, microbiology, ancillary and laboratory) are listed below for reference.     Procedures and Diagnostic Studies:   US Abdomen Complete  Result Date: 08/23/2021 CLINICAL DATA:  30-year-old female with hepatitis B. EXAM: ABDOMEN ULTRASOUND COMPLETE COMPARISON:  06/01/2021 CT FINDINGS: Gallbladder: The gallbladder is unremarkable. There is no evidence of cholelithiasis or acute cholecystitis. Common bile duct: Diameter: 3 mm. There is no evidence of intrahepatic or extrahepatic biliary dilatation. Liver: No focal lesion identified. Within normal limits in parenchymal echogenicity. Portal vein is patent on color Doppler imaging with normal direction of blood flow towards the liver. IVC: No abnormality visualized. Pancreas: Visualized portion unremarkable. Spleen: Size and appearance within normal limits. Right Kidney: Length: 10.4 cm. Echogenicity within normal limits. No mass or   hydronephrosis visualized. Left Kidney: Length: 11.4 cm. Echogenicity within normal limits. No mass or hydronephrosis visualized. Abdominal aorta: No aneurysm visualized. Other findings: None. IMPRESSION: Unremarkable abdominal ultrasound.  Normal appearing liver. Electronically Signed   By: Jeffrey  Hu M.D.   On: 08/23/2021 15:16   DG Ankle 2 Views Right  Result Date: 08/23/2021 CLINICAL DATA:  RIGHT ankle pain. EXAM: RIGHT ANKLE - 2 VIEW COMPARISON:  None Available.  FINDINGS: There is no evidence of acute fracture, subluxation or dislocation. MEDIAL soft tissue swelling is present. The joint space is unremarkable. IMPRESSION: MEDIAL soft tissue swelling without acute bony abnormality. Electronically Signed   By: Jeffrey  Hu M.D.   On: 08/23/2021 15:13   DG Ankle 2 Views Left  Result Date: 08/23/2021 CLINICAL DATA:  Acute LEFT ankle pain. EXAM: LEFT ANKLE - 2 VIEW COMPARISON:  None Available. FINDINGS: There is no evidence of acute fracture, subluxation or dislocation. MEDIAL soft tissue swelling is noted. The joint spaces are unremarkable. IMPRESSION: MEDIAL soft tissue swelling without acute bony abnormality. Electronically Signed   By: Jeffrey  Hu M.D.   On: 08/23/2021 15:12   DG Chest 2 View  Result Date: 08/22/2021 CLINICAL DATA:  Chest pain, leg swelling and tachycardia. EXAM: CHEST - 2 VIEW COMPARISON:  Chest radiograph September 25, 2007. FINDINGS: The heart size and mediastinal contours are within normal limits. No focal airspace consolidation. No pleural effusion. No pneumothorax. The visualized skeletal structures are unremarkable. IMPRESSION: No acute cardiopulmonary process. Electronically Signed   By: Jeffrey  Waltz M.D.   On: 08/22/2021 15:06     Labs:   Basic Metabolic Panel: Recent Labs  Lab 08/22/21 1309 08/23/21 0048  NA 133* 139  K 3.7 3.5  CL 105 113*  CO2 22 21*  GLUCOSE 98 112*  BUN 11 6  CREATININE 0.60 0.53  CALCIUM 7.9* 7.7*   GFR Estimated Creatinine Clearance: 69.5 mL/min (by C-G formula based on SCr of 0.53 mg/dL). Liver Function Tests: Recent Labs  Lab 08/22/21 1309 08/23/21 0048  AST 133* 99*  ALT 94* 73*  ALKPHOS 119 95  BILITOT 0.5 0.4  PROT 7.8 6.7  ALBUMIN 2.6* 2.2*   No results for input(s): "LIPASE", "AMYLASE" in the last 168 hours. No results for input(s): "AMMONIA" in the last 168 hours. Coagulation profile Recent Labs  Lab 08/22/21 1525  INR 0.9    CBC: Recent Labs  Lab 08/22/21 1309  08/23/21 0048  WBC 3.6* 2.1*  NEUTROABS 2.8  --   HGB 10.9* 13.2  HCT 34.7* 41.8  MCV 87.6 85.8  PLT 308 235   Cardiac Enzymes: No results for input(s): "CKTOTAL", "CKMB", "CKMBINDEX", "TROPONINI" in the last 168 hours. BNP: Invalid input(s): "POCBNP" CBG: No results for input(s): "GLUCAP" in the last 168 hours. D-Dimer No results for input(s): "DDIMER" in the last 72 hours. Hgb A1c No results for input(s): "HGBA1C" in the last 72 hours. Lipid Profile No results for input(s): "CHOL", "HDL", "LDLCALC", "TRIG", "CHOLHDL", "LDLDIRECT" in the last 72 hours. Thyroid function studies Recent Labs    08/22/21 1525  TSH 2.162   Anemia work up No results for input(s): "VITAMINB12", "FOLATE", "FERRITIN", "TIBC", "IRON", "RETICCTPCT" in the last 72 hours. Microbiology Recent Results (from the past 240 hour(s))  Blood culture (routine x 2)     Status: None (Preliminary result)   Collection Time: 08/22/21  2:11 PM   Specimen: BLOOD RIGHT FOREARM  Result Value Ref Range Status   Specimen Description BLOOD RIGHT FOREARM  Final     Special Requests   Final    BOTTLES DRAWN AEROBIC AND ANAEROBIC Blood Culture adequate volume   Culture   Final    NO GROWTH 2 DAYS Performed at [facility], [place] [address] [place], [place] [address]    Report Status PENDING  Incomplete  Resp Panel by RT-PCR (Flu A&B, Covid) Anterior Nasal Swab     Status: None   Collection Time: 08/22/21  2:12 PM   Specimen: Anterior Nasal Swab  Result Value Ref Range Status   SARS Coronavirus 2 by RT PCR NEGATIVE NEGATIVE Final    Comment: (NOTE) SARS-CoV-2 target nucleic acids are NOT DETECTED.  The SARS-CoV-2 RNA is generally detectable in upper respiratory specimens during the acute phase of infection. The lowest concentration of SARS-CoV-2 viral copies this assay can detect is 138 copies/mL. A negative result does not preclude SARS-Cov-2 infection and should not be used as the sole basis for treatment  or other patient management decisions. A negative result may occur with  improper specimen collection/handling, submission of specimen other than nasopharyngeal swab, presence of viral mutation(s) within the areas targeted by this assay, and inadequate number of viral copies(<138 copies/mL). A negative result must be combined with clinical observations, patient history, and epidemiological information. The expected result is Negative.  Fact Sheet for Patients:  EntrepreneurPulse.com.au  Fact Sheet for Healthcare Providers:  IncredibleEmployment.be  This test is no t yet approved or cleared by the [place] FDA and  has been authorized for detection and/or diagnosis of SARS-CoV-2 by FDA under an Emergency Use Authorization (EUA). This EUA will remain  in effect (meaning this test can be used) for the duration of the COVID-19 declaration under Section 564(b)(1) of the Act, 21 U.S.C.section 360bbb-3(b)(1), unless the authorization is terminated  or revoked sooner.       Influenza A by PCR NEGATIVE NEGATIVE Final   Influenza B by PCR NEGATIVE NEGATIVE Final    Comment: (NOTE) The Xpert Xpress SARS-CoV-2/FLU/RSV plus assay is intended as an aid in the diagnosis of influenza from Nasopharyngeal swab specimens and should not be used as a sole basis for treatment. Nasal washings and aspirates are unacceptable for Xpert Xpress SARS-CoV-2/FLU/RSV testing.  Fact Sheet for Patients: EntrepreneurPulse.com.au  Fact Sheet for Healthcare Providers: IncredibleEmployment.be  This test is not yet approved or cleared by the [place] FDA and has been authorized for detection and/or diagnosis of SARS-CoV-2 by FDA under an Emergency Use Authorization (EUA). This EUA will remain in effect (meaning this test can be used) for the duration of the COVID-19 declaration under Section 564(b)(1) of the Act, 21 U.S.C. section  360bbb-3(b)(1), unless the authorization is terminated or revoked.  Performed at [facility], [place] [address], [place], [place] [address]   Urine Culture     Status: Abnormal   Collection Time: 08/22/21  2:12 PM   Specimen: In/Out Cath Urine  Result Value Ref Range Status   Specimen Description IN/OUT CATH URINE  Final   Special Requests   Final    NONE Performed at [facility], [place] [address], [place], [place] [address]    Culture MULTIPLE SPECIES PRESENT, SUGGEST RECOLLECTION (A)  Final   Report Status 08/23/2021 FINAL  Final  Respiratory (~20 pathogens) panel by PCR     Status: None   Collection Time: 08/22/21  2:12 PM   Specimen: Nasopharyngeal Swab; Respiratory  Result Value Ref Range Status   Adenovirus NOT DETECTED NOT DETECTED Final   Coronavirus  229E NOT DETECTED NOT DETECTED Final    Comment: (NOTE) The Coronavirus on the Respiratory Panel, DOES NOT test for the novel  Coronavirus (2019 nCoV)    Coronavirus HKU1 NOT DETECTED NOT DETECTED Final   Coronavirus NL63 NOT DETECTED NOT DETECTED Final   Coronavirus OC43 NOT DETECTED NOT DETECTED Final   Metapneumovirus NOT DETECTED NOT DETECTED Final   Rhinovirus / Enterovirus NOT DETECTED NOT DETECTED Final   Influenza A NOT DETECTED NOT DETECTED Final   Influenza B NOT DETECTED NOT DETECTED Final   Parainfluenza Virus 1 NOT DETECTED NOT DETECTED Final   Parainfluenza Virus 2 NOT DETECTED NOT DETECTED Final   Parainfluenza Virus 3 NOT DETECTED NOT DETECTED Final   Parainfluenza Virus 4 NOT DETECTED NOT DETECTED Final   Respiratory Syncytial Virus NOT DETECTED NOT DETECTED Final   Bordetella pertussis NOT DETECTED NOT DETECTED Final   Bordetella Parapertussis NOT DETECTED NOT DETECTED Final   Chlamydophila pneumoniae NOT DETECTED NOT DETECTED Final   Mycoplasma pneumoniae NOT DETECTED NOT DETECTED Final    Comment: Performed at [facility], [place] [address], [place], [place] [address]  Blood culture  (routine x 2)     Status: None (Preliminary result)   Collection Time: 08/22/21  2:16 PM   Specimen: BLOOD  Result Value Ref Range Status   Specimen Description BLOOD LEFT ANTECUBITAL  Final   Special Requests   Final    BOTTLES DRAWN AEROBIC AND ANAEROBIC Blood Culture adequate volume   Culture   Final    NO GROWTH 2 DAYS Performed at [facility], [place] [address], [place], [place] [address]    Report Status PENDING  Incomplete     Discharge Instructions:   Discharge Instructions     Diet general   Complete by: As directed    Increase activity slowly   Complete by: As directed       Allergies as of 08/24/2021   No Known Allergies      Medication List     STOP taking these medications    Blood Pressure Kit Devi   Prenatal Vitamin 27-0.8 MG Tabs       TAKE these medications    acetaminophen 325 MG tablet Commonly known as: Tylenol Take 2 tablets (650 mg total) by mouth every 4 (four) hours as needed (for pain scale < 4). What changed: Another medication with the same name was removed. Continue taking this medication, and follow the directions you see here.   naproxen 500 MG tablet Commonly known as: NAPROSYN Take 1 tablet (500 mg total) by mouth 2 (two) times daily with a meal.        Follow-up Information     Long, Scott, PA-C Follow up in 1 week(s).   Specialty: Physician Assistant Contact information: [address] [place] [address] [phone]         Collier Salina, MD Follow up.   Specialty: Rheumatology Contact information: [address] [place] [address] [phone]         Jabier Mutton, MD Follow up.   Specialty: Infectious Diseases Contact information: [address] [place] [address] [phone]                  Time coordinating discharge: 45 min  Signed:  Geradine Girt DO  [facility] 08/24/2021, 4:14 PM

## 2021-08-24 NOTE — Progress Notes (Signed)
PROGRESS NOTE    Victoria Fitzpatrick  KCM:[id number] DOB: 1990-05-26 DOA: 08/22/2021 PCP: Lindaann Pascal, PA-C    Brief Narrative:  Victoria Fitzpatrick is a 31 y.o. female with medical history significant of rheumatoid arthritis presenting with tachycardia, fever, edema. Patient reports ongoing ankle edema for the past month.  She began to have pain in her ankle today so she was evaluated in urgent care while there she was found to have fever rash and tachycardia and she was into the ED for further evaluation. She was reporting rash on her palms and soles for about a week and fever for the past several days around 100 Fahrenheit at home.   ID consulted  Assessment and Plan: SIRS ?Acute Hepatitis B > Patient presenting with tachycardia and lower extremity edema.  Also with fevers for the past several days and rash on the palms and soles for a week.  Meets SIRS criteria with fever, tachycardia, leukopenia. > Lab work-up came back positive for hepatitis B core IgM only which would be consistent with acute hepatitis in the window phase. -ID consulted -abd U/S normal -multiple labs pending -abx per ID -RPR negative   DVT prophylaxis: enoxaparin (LOVENOX) injection 30 mg Start: 08/22/21 2200    Code Status: Full Code Family Communication:   Disposition Plan:  Level of care: Telemetry Medical Status is: Inpatient Remains inpatient appropriate because: ID work up in process    Consultants:  ID   Subjective: Overall feeling better  Objective: Vitals:   08/24/21 0041 08/24/21 0327 08/24/21 0557 08/24/21 0933  BP: 100/61 126/76 105/76 117/81  Pulse: (!) 129 (!) 116 (!) 110 (!) 106  Resp: 17 20 18    Temp: 99.6 F (37.6 C) 98.2 F (36.8 C) 98.4 F (36.9 C) (!) 97.5 F (36.4 C)  TempSrc: Oral Oral Oral Oral  SpO2: 98% 99% 99% 99%  Weight:        Intake/Output Summary (Last 24 hours) at 08/24/2021 1153 Last data filed at 08/24/2021 0933 Gross per 24 hour  Intake 240 ml  Output --  Net 240  ml   Filed Weights   08/23/21 1900  Weight: 48.1 kg    Examination:   General: Appearance:    Well developed, well nourished female in no acute distress     Lungs:     respirations unlabored  Heart:    Tachycardic. Normal rhythm. No murmurs, rubs, or gallops.    MS:   All extremities are intact. Rashes on palms, toes      Neurologic:   Awake, alert       Data Reviewed: I have personally reviewed following labs and imaging studies  CBC: Recent Labs  Lab 08/22/21 1309 08/23/21 0048  WBC 3.6* 2.1*  NEUTROABS 2.8  --   HGB 10.9* 13.2  HCT 34.7* 41.8  MCV 87.6 85.8  PLT 308 235   Basic Metabolic Panel: Recent Labs  Lab 08/22/21 1309 08/23/21 0048  NA 133* 139  K 3.7 3.5  CL 105 113*  CO2 22 21*  GLUCOSE 98 112*  BUN 11 6  CREATININE 0.60 0.53  CALCIUM 7.9* 7.7*   GFR: Estimated Creatinine Clearance: 69.5 mL/min (by C-G formula based on SCr of 0.53 mg/dL). Liver Function Tests: Recent Labs  Lab 08/22/21 1309 08/23/21 0048  AST 133* 99*  ALT 94* 73*  ALKPHOS 119 95  BILITOT 0.5 0.4  PROT 7.8 6.7  ALBUMIN 2.6* 2.2*   No results for input(s): "LIPASE", "AMYLASE" in the last 168 hours.  No results for input(s): "AMMONIA" in the last 168 hours. Coagulation Profile: Recent Labs  Lab 08/22/21 1525  INR 0.9   Cardiac Enzymes: No results for input(s): "CKTOTAL", "CKMB", "CKMBINDEX", "TROPONINI" in the last 168 hours. BNP (last 3 results) No results for input(s): "PROBNP" in the last 8760 hours. HbA1C: No results for input(s): "HGBA1C" in the last 72 hours. CBG: No results for input(s): "GLUCAP" in the last 168 hours. Lipid Profile: No results for input(s): "CHOL", "HDL", "LDLCALC", "TRIG", "CHOLHDL", "LDLDIRECT" in the last 72 hours. Thyroid Function Tests: Recent Labs    08/22/21 1525  TSH 2.162   Anemia Panel: No results for input(s): "VITAMINB12", "FOLATE", "FERRITIN", "TIBC", "IRON", "RETICCTPCT" in the last 72 hours. Sepsis  Labs: Recent Labs  Lab 08/22/21 1525  LATICACIDVEN 1.0    Recent Results (from the past 240 hour(s))  Blood culture (routine x 2)     Status: None (Preliminary result)   Collection Time: 08/22/21  2:11 PM   Specimen: BLOOD RIGHT FOREARM  Result Value Ref Range Status   Specimen Description BLOOD RIGHT FOREARM  Final   Special Requests   Final    BOTTLES DRAWN AEROBIC AND ANAEROBIC Blood Culture adequate volume   Culture   Final    NO GROWTH 2 DAYS Performed at [facility], [address] [place], [place] [address]    Report Status PENDING  Incomplete  Resp Panel by RT-PCR (Flu A&B, Covid) Anterior Nasal Swab     Status: None   Collection Time: 08/22/21  2:12 PM   Specimen: Anterior Nasal Swab  Result Value Ref Range Status   SARS Coronavirus 2 by RT PCR NEGATIVE NEGATIVE Final    Comment: (NOTE) SARS-CoV-2 target nucleic acids are NOT DETECTED.  The SARS-CoV-2 RNA is generally detectable in upper respiratory specimens during the acute phase of infection. The lowest concentration of SARS-CoV-2 viral copies this assay can detect is 138 copies/mL. A negative result does not preclude SARS-Cov-2 infection and should not be used as the sole basis for treatment or other patient management decisions. A negative result may occur with  improper specimen collection/handling, submission of specimen other than nasopharyngeal swab, presence of viral mutation(s) within the areas targeted by this assay, and inadequate number of viral copies(<138 copies/mL). A negative result must be combined with clinical observations, patient history, and epidemiological information. The expected result is Negative.  Fact Sheet for Patients:  BloggerCourse.com  Fact Sheet for Healthcare Providers:  SeriousBroker.it  This test is no t yet approved or cleared by the [place] FDA and  has been authorized for detection and/or diagnosis of  SARS-CoV-2 by FDA under an Emergency Use Authorization (EUA). This EUA will remain  in effect (meaning this test can be used) for the duration of the COVID-19 declaration under Section 564(b)(1) of the Act, 21 U.S.C.section 360bbb-3(b)(1), unless the authorization is terminated  or revoked sooner.       Influenza A by PCR NEGATIVE NEGATIVE Final   Influenza B by PCR NEGATIVE NEGATIVE Final    Comment: (NOTE) The Xpert Xpress SARS-CoV-2/FLU/RSV plus assay is intended as an aid in the diagnosis of influenza from Nasopharyngeal swab specimens and should not be used as a sole basis for treatment. Nasal washings and aspirates are unacceptable for Xpert Xpress SARS-CoV-2/FLU/RSV testing.  Fact Sheet for Patients: BloggerCourse.com  Fact Sheet for Healthcare Providers: SeriousBroker.it  This test is not yet approved or cleared by the [place] FDA and has been authorized for detection  and/or diagnosis of SARS-CoV-2 by FDA under an Emergency Use Authorization (EUA). This EUA will remain in effect (meaning this test can be used) for the duration of the COVID-19 declaration under Section 564(b)(1) of the Act, 21 U.S.C. section 360bbb-3(b)(1), unless the authorization is terminated or revoked.  Performed at [facility], [place] [address], [place], [place] [address]   Urine Culture     Status: Abnormal   Collection Time: 08/22/21  2:12 PM   Specimen: In/Out Cath Urine  Result Value Ref Range Status   Specimen Description IN/OUT CATH URINE  Final   Special Requests   Final    NONE Performed at [facility], [place] [address], [place], [place] [address]    Culture MULTIPLE SPECIES PRESENT, SUGGEST RECOLLECTION (A)  Final   Report Status 08/23/2021 FINAL  Final  Respiratory (~20 pathogens) panel by PCR     Status: None   Collection Time: 08/22/21  2:12 PM   Specimen: Nasopharyngeal Swab; Respiratory  Result Value Ref Range  Status   Adenovirus NOT DETECTED NOT DETECTED Final   Coronavirus 229E NOT DETECTED NOT DETECTED Final    Comment: (NOTE) The Coronavirus on the Respiratory Panel, DOES NOT test for the novel  Coronavirus (2019 nCoV)    Coronavirus HKU1 NOT DETECTED NOT DETECTED Final   Coronavirus NL63 NOT DETECTED NOT DETECTED Final   Coronavirus OC43 NOT DETECTED NOT DETECTED Final   Metapneumovirus NOT DETECTED NOT DETECTED Final   Rhinovirus / Enterovirus NOT DETECTED NOT DETECTED Final   Influenza A NOT DETECTED NOT DETECTED Final   Influenza B NOT DETECTED NOT DETECTED Final   Parainfluenza Virus 1 NOT DETECTED NOT DETECTED Final   Parainfluenza Virus 2 NOT DETECTED NOT DETECTED Final   Parainfluenza Virus 3 NOT DETECTED NOT DETECTED Final   Parainfluenza Virus 4 NOT DETECTED NOT DETECTED Final   Respiratory Syncytial Virus NOT DETECTED NOT DETECTED Final   Bordetella pertussis NOT DETECTED NOT DETECTED Final   Bordetella Parapertussis NOT DETECTED NOT DETECTED Final   Chlamydophila pneumoniae NOT DETECTED NOT DETECTED Final   Mycoplasma pneumoniae NOT DETECTED NOT DETECTED Final    Comment: Performed at [facility], [place] [address], [place], [place] [address]  Blood culture (routine x 2)     Status: None (Preliminary result)   Collection Time: 08/22/21  2:16 PM   Specimen: BLOOD  Result Value Ref Range Status   Specimen Description BLOOD LEFT ANTECUBITAL  Final   Special Requests   Final    BOTTLES DRAWN AEROBIC AND ANAEROBIC Blood Culture adequate volume   Culture   Final    NO GROWTH 2 DAYS Performed at [facility], [place] [address], [place], [place] [address]    Report Status PENDING  Incomplete         Radiology Studies: US Abdomen Complete  Result Date: 08/23/2021 CLINICAL DATA:  31 year old female with hepatitis B. EXAM: ABDOMEN ULTRASOUND COMPLETE COMPARISON:  06/01/2021 CT FINDINGS: Gallbladder: The gallbladder is unremarkable. There is no evidence of  cholelithiasis or acute cholecystitis. Common bile duct: Diameter: 3 mm. There is no evidence of intrahepatic or extrahepatic biliary dilatation. Liver: No focal lesion identified. Within normal limits in parenchymal echogenicity. Portal vein is patent on color Doppler imaging with normal direction of blood flow towards the liver. IVC: No abnormality visualized. Pancreas: Visualized portion unremarkable. Spleen: Size and appearance within normal limits. Right Kidney: Length: 10.4 cm. Echogenicity within normal limits. No mass or hydronephrosis visualized.  Left Kidney: Length: 11.4 cm. Echogenicity within normal limits. No mass or hydronephrosis visualized. Abdominal aorta: No aneurysm visualized. Other findings: None. IMPRESSION: Unremarkable abdominal ultrasound.  Normal appearing liver. Electronically Signed   By: Margarette Canada M.D.   On: 08/23/2021 15:16   DG Ankle 2 Views Right  Result Date: 08/23/2021 CLINICAL DATA:  RIGHT ankle pain. EXAM: RIGHT ANKLE - 2 VIEW COMPARISON:  None Available. FINDINGS: There is no evidence of acute fracture, subluxation or dislocation. MEDIAL soft tissue swelling is present. The joint space is unremarkable. IMPRESSION: MEDIAL soft tissue swelling without acute bony abnormality. Electronically Signed   By: Margarette Canada M.D.   On: 08/23/2021 15:13   DG Ankle 2 Views Left  Result Date: 08/23/2021 CLINICAL DATA:  Acute LEFT ankle pain. EXAM: LEFT ANKLE - 2 VIEW COMPARISON:  None Available. FINDINGS: There is no evidence of acute fracture, subluxation or dislocation. MEDIAL soft tissue swelling is noted. The joint spaces are unremarkable. IMPRESSION: MEDIAL soft tissue swelling without acute bony abnormality. Electronically Signed   By: Margarette Canada M.D.   On: 08/23/2021 15:12   DG Chest 2 View  Result Date: 08/22/2021 CLINICAL DATA:  Chest pain, leg swelling and tachycardia. EXAM: CHEST - 2 VIEW COMPARISON:  Chest radiograph September 25, 2007. FINDINGS: The heart size and  mediastinal contours are within normal limits. No focal airspace consolidation. No pleural effusion. No pneumothorax. The visualized skeletal structures are unremarkable. IMPRESSION: No acute cardiopulmonary process. Electronically Signed   By: Dahlia Bailiff M.D.   On: 08/22/2021 15:06        Scheduled Meds:  enoxaparin (LOVENOX) injection  30 mg Subcutaneous Q24H   sodium chloride flush  3 mL Intravenous Q12H   Continuous Infusions:  lactated ringers 100 mL/hr at 08/23/21 2132     LOS: 1 day    Time spent: 45 minutes spent on chart review, discussion with nursing staff, consultants, updating family and interview/physical exam; more than 50% of that time was spent in counseling and/or coordination of care.    Geradine Girt, DO [facility] Available via Epic secure chat 7am-7pm After these hours, please refer to coverage provider listed on [email or web address] 08/24/2021, 11:53 AM

## 2021-08-24 NOTE — Progress Notes (Signed)
Pt admitted for SIRS, Acute Hepatitis B, fever and rash.  Pt has been involved and participated in her care plan. Improvement noted with treatment plan. No fever and swelling in ankles down. She denies pain, appetite and fluid intake good with no nausea or vomiting.  Respirations regular, unlabored and no SOB episodes.  All personal belongings at bedside returned to patient with husband at bedside. IV X 2 removed with tips intact, no swelling/redness or s/s of infection at site and tolerated well.  Heart monitor discontinued. Appointments set-up for follow-up with MD post discharge.  Patient discharged to home via personal transportation with husband at her side.

## 2021-08-24 NOTE — Plan of Care (Signed)

## 2021-08-25 LAB — ROCKY MTN SPOTTED FVR ABS PNL(IGG+IGM)
RMSF IgG: NEGATIVE
RMSF IgM: 1 index — ABNORMAL HIGH (ref 0.00–0.89)

## 2021-08-25 LAB — HEPATITIS B DNA, ULTRAQUANTITATIVE, PCR
HBV DNA SERPL PCR-ACNC: NOT DETECTED IU/mL
HBV DNA SERPL PCR-LOG IU: UNDETERMINED log10 IU/mL

## 2021-08-25 LAB — C4 COMPLEMENT: Complement C4, Body Fluid: 13 mg/dL (ref 12–38)

## 2021-08-25 LAB — HEPATITIS B CORE ANTIBODY, IGM: Hep B C IgM: REACTIVE — AB

## 2021-08-25 LAB — C3 COMPLEMENT: C3 Complement: 49 mg/dL — ABNORMAL LOW (ref 82–167)

## 2021-08-25 LAB — HEPATITIS B E ANTIBODY: Hep B E Ab: NEGATIVE

## 2021-08-25 LAB — COMPLEMENT, TOTAL: Compl, Total (CH50): 26 U/mL — ABNORMAL LOW (ref >41)

## 2021-08-26 LAB — HEPATITIS B E ANTIGEN: Hep B E Ag: NEGATIVE

## 2021-08-27 LAB — CULTURE, BLOOD (ROUTINE X 2)
Culture: NO GROWTH
Culture: NO GROWTH
Special Requests: ADEQUATE
Special Requests: ADEQUATE

## 2021-08-27 LAB — EHRLICHIA ANTIBODY PANEL
E chaffeensis (HGE) Ab, IgG: NEGATIVE
E chaffeensis (HGE) Ab, IgM: NEGATIVE
E. Chaffeensis (HME) IgM Titer: NEGATIVE
E.Chaffeensis (HME) IgG: NEGATIVE

## 2021-09-01 NOTE — Progress Notes (Signed)
Office Visit Note  Patient: Victoria Fitzpatrick             Date of Birth: [birth year]           MRN: [id number]             PCP: Shanon Rosser, PA-C Referring: Shanon Rosser, PA-C Visit Date: 09/03/2021   Subjective:   History of Present Illness: Victoria Fitzpatrick is a 31 y.o. female here for follow up for rheumatoid arthritis on treatment with as needed NSAIDs but after recent hospitalization. She was admitted to the hospital about 2 weeks ago with fever, rashes, and ankle swelling. Lab workup showed leukopenia, highly elevated rheumatoid factor, and decreased complement C3 was also positive for hepatitis B core IgM. RMSF IgM was equivocal. She received empiric doxycycline dose without complete treatment course. She has not experienced a significant change in joint pains or swelling outside of bilateral ankles. Fevers have improved but rash remains on all extremities.  Previous HPI 04/24/2021 Victoria Fitzpatrick is a 31 y.o. female here for follow up with bilateral hand and knee pain significantly positive RF and ESR trying daily NSAID treatment. She stopped taking aleve due to not seeing any difference in her symptoms. She started trying an antiinflammatory diet and some herbal supplements and symptoms are doing well, she has not suffered much trouble in the past 2 weeks. Still stiff just a few minutes in morning times.   Previous HPI 03/19/21 Victoria Fitzpatrick is a 31 y.o. female here for evaluation of bilateral hands and knees joint pains since last year. This started last summer she does not recall any preceding illness or changes before problems started. She has had wrist pain years ago but no prior chronic issue in the current areas. Labs showed RF of 24, ESR of 51, and xrays of hands and knees were unremarkable. She took prednisone for 2 weeks with great improvement of symptoms but returned after finishing the medication. She is taking a daily supplement joint complex containing shellfish products no other medications. Hand  and wrist and knee pain bothers her during work but worst stiffness is first thing in the mornings.   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  HENT:  Negative for mouth sores and mouth dryness.   Eyes:  Negative for dryness.  Respiratory:  Negative for shortness of breath.   Cardiovascular:  Negative for chest pain and palpitations.  Gastrointestinal:  Negative for blood in stool, constipation and diarrhea.  Endocrine: Negative for increased urination.  Genitourinary:  Negative for involuntary urination.  Musculoskeletal:  Positive for joint pain, joint pain, joint swelling, myalgias, morning stiffness, muscle tenderness and myalgias. Negative for gait problem and muscle weakness.  Skin:  Positive for rash, hair loss and sensitivity to sunlight. Negative for color change.  Allergic/Immunologic: Positive for susceptible to infections.  Neurological:  Negative for dizziness and headaches.  Hematological:  Negative for swollen glands.  Psychiatric/Behavioral:  Negative for depressed mood and sleep disturbance. The patient is not nervous/anxious.     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   Rash    Fever 08/23/2021   SIRS (systemic inflammatory response syndrome) ([place]) 08/22/2021  Acute hepatitis B 08/22/2021   Supervision of high risk pregnancy, antepartum 06/12/2018   History of low birth weight 06/12/2018   Low grade squamous intraepithelial lesion (LGSIL) on cervical Pap smear 06/12/2018    Past Medical History:  Diagnosis Date   Medical history non-contributory    Normal labor 12/06/2018   Rheumatoid arthritis ([place])     Family History  Problem  Relation Age of Onset   Liver disease Brother    Past Surgical History:  Procedure Laterality Date   NO PAST SURGERIES     Social History   Social History Narrative   Not on file   Immunization History  Administered Date(s) Administered   Influenza,inj,Quad PF,6+ Mos 10/10/2013, 10/12/2018   Tdap 10/31/2013, 10/12/2018     Objective: Vital Signs: BP 110/78 (BP Location: Right Arm, Patient Position: Sitting, Cuff Size: Normal)   Pulse (!) 137   Resp 14   Ht $R'4\' 11"'RH$  (1.499 m)   Wt 101 lb 9.6 oz (46.1 kg)   BMI 20.52 kg/m    Physical Exam HENT:     Nose: Nose normal.     Mouth/Throat:     Mouth: Mucous membranes are moist.     Pharynx: Oropharynx is clear.  Eyes:     Conjunctiva/sclera: Conjunctivae normal.  Cardiovascular:     Rate and Rhythm: Regular rhythm. Tachycardia present.  Pulmonary:     Effort: Pulmonary effort is normal.     Breath sounds: Normal breath sounds.  Lymphadenopathy:     Cervical: No cervical adenopathy.  Skin:    General: Skin is warm and dry.     Findings: Rash present.  Neurological:     Mental Status: She is alert.  Psychiatric:        Mood and Affect: Mood normal.             Musculoskeletal Exam:  Shoulders full ROM no tenderness or swelling Elbows full ROM no tenderness or swelling Wrists full ROM no tenderness or swelling Fingers full ROM no tenderness or swelling Knees full ROM no tenderness or swelling Ankles with tenderness to palpation worst anteriorly, there is mild soft tissue swelling on anterior and medial sides, no warmth MTPs full ROM no tenderness or swelling    Investigation: No additional findings.  Imaging: US Abdomen Complete  Result Date: 08/23/2021 CLINICAL DATA:  32 year old female with hepatitis B. EXAM: ABDOMEN ULTRASOUND COMPLETE COMPARISON:  06/01/2021 CT FINDINGS: Gallbladder: The gallbladder is unremarkable. There is no evidence of cholelithiasis or acute cholecystitis. Common bile duct:  Diameter: 3 mm. There is no evidence of intrahepatic or extrahepatic biliary dilatation. Liver: No focal lesion identified. Within normal limits in parenchymal echogenicity. Portal vein is patent on color Doppler imaging with normal direction of blood flow towards the liver. IVC: No abnormality visualized. Pancreas: Visualized portion unremarkable. Spleen: Size and appearance within normal limits. Right Kidney: Length: 10.4 cm. Echogenicity within normal limits. No mass or hydronephrosis visualized. Left Kidney: Length: 11.4 cm. Echogenicity within normal limits. No mass or hydronephrosis visualized. Abdominal aorta: No aneurysm visualized. Other findings: None. IMPRESSION: Unremarkable abdominal ultrasound.  Normal appearing liver. Electronically Signed   By: Margarette Canada M.D.   On: 08/23/2021 15:16   DG Ankle 2 Views Right  Result Date: 08/23/2021 CLINICAL DATA:  RIGHT ankle pain. EXAM: RIGHT ANKLE - 2 VIEW COMPARISON:  None Available. FINDINGS: There is no evidence of acute fracture, subluxation or dislocation. MEDIAL soft tissue swelling is present. The joint space is unremarkable. IMPRESSION: MEDIAL  soft tissue swelling without acute bony abnormality. Electronically Signed   By: Margarette Canada M.D.   On: 08/23/2021 15:13   DG Ankle 2 Views Left  Result Date: 08/23/2021 CLINICAL DATA:  Acute LEFT ankle pain. EXAM: LEFT ANKLE - 2 VIEW COMPARISON:  None Available. FINDINGS: There is no evidence of acute fracture, subluxation or dislocation. MEDIAL soft tissue swelling is noted. The joint spaces are unremarkable. IMPRESSION: MEDIAL soft tissue swelling without acute bony abnormality. Electronically Signed   By: Margarette Canada M.D.   On: 08/23/2021 15:12   DG Chest 2 View  Result Date: 08/22/2021 CLINICAL DATA:  Chest pain, leg swelling and tachycardia. EXAM: CHEST - 2 VIEW COMPARISON:  Chest radiograph September 25, 2007. FINDINGS: The heart size and mediastinal contours are within normal limits. No focal  airspace consolidation. No pleural effusion. No pneumothorax. The visualized skeletal structures are unremarkable. IMPRESSION: No acute cardiopulmonary process. Electronically Signed   By: Dahlia Bailiff M.D.   On: 08/22/2021 15:06    Recent Labs: Lab Results  Component Value Date   WBC 4.9 09/03/2021   HGB 12.0 09/03/2021   PLT 300 09/03/2021   NA 132 (L) 09/03/2021   K 4.1 09/03/2021   CL 100 09/03/2021   CO2 22 09/03/2021   GLUCOSE 87 09/03/2021   BUN 9 09/03/2021   CREATININE 0.61 09/03/2021   BILITOT 0.4 09/03/2021   ALKPHOS 95 08/23/2021   AST 181 (H) 09/03/2021   ALT 53 (H) 09/03/2021   PROT 8.7 (H) 09/03/2021   ALBUMIN 2.2 (L) 08/23/2021   CALCIUM 8.2 (L) 09/03/2021   GFRAA >60 12/06/2018    Speciality Comments: No specialty comments available.  Procedures:  No procedures performed Allergies: Patient has no known allergies.   Assessment / Plan:     Visit Diagnoses: Rheumatoid arthritis involving multiple sites with positive rheumatoid factor (HCC) Rheumatoid factor positive - Plan: Sedimentation rate, CBC with Differential/Platelet, COMPLETE METABOLIC PANEL WITH GFR, IgG, IgA, IgM  Inflammatory arthritis symptoms are not significantly increased compared to her overall presentation. Ankle swelling is very minimal on examination today. Rashes do not appear typical for RA vasculitis or neutrophilic dermatoses with no palpable purpura or papular lesions. Hepatic involvement is also very uncommon for rheumatoid arthritis and she was a relatively recent diagnosis which is uncommon to see diffuse organ system involvement. Rechecking sedimentation rate, blood count, and metabolic panel to see if these are improving compared to values at the hospital.   Acute hepatitis B  She has upcoming ID clinic follow up to confirm if this finding is true hepatitis infection versus serologic positive. Elevated RF and decreased complement would be consistent serologic findings for acute  infectious response. Currently would not recommend any kind of DMARD treatment until active infection resolving or ruled out.  Hypocomplementemia (HCC)  Low complement C3 atypical for RA activity. Positive ANA checked in the hospital but history and presentation is not typical of systemic lupus. No evidence of renal function changes or nephrotic syndrome.  Orders: Orders Placed This Encounter  Procedures   Sedimentation rate   CBC with Differential/Platelet   COMPLETE METABOLIC PANEL WITH GFR   IgG, IgA, IgM   No orders of the defined types were placed in this encounter.    Follow-Up Instructions: No follow-ups on file.   Collier Salina, MD  Note - This record has been created using [employer].  Chart creation errors have been sought, but may not always  have been located. Such creation errors  do not reflect on  the standard of medical care.

## 2021-09-02 DIAGNOSIS — R197 Diarrhea, unspecified: Secondary | ICD-10-CM | POA: Diagnosis not present

## 2021-09-02 DIAGNOSIS — R1030 Lower abdominal pain, unspecified: Secondary | ICD-10-CM | POA: Diagnosis not present

## 2021-09-02 DIAGNOSIS — N390 Urinary tract infection, site not specified: Secondary | ICD-10-CM | POA: Diagnosis not present

## 2021-09-02 DIAGNOSIS — R112 Nausea with vomiting, unspecified: Secondary | ICD-10-CM | POA: Diagnosis not present

## 2021-09-03 ENCOUNTER — Encounter: Payer: Self-pay | Admitting: Internal Medicine

## 2021-09-03 ENCOUNTER — Ambulatory Visit: Payer: Medicaid Other | Attending: Internal Medicine | Admitting: Internal Medicine

## 2021-09-03 VITALS — BP 110/78 | HR 137 | Resp 14 | Ht 59.0 in | Wt 101.6 lb

## 2021-09-03 DIAGNOSIS — D841 Defects in the complement system: Secondary | ICD-10-CM | POA: Diagnosis not present

## 2021-09-03 DIAGNOSIS — M0579 Rheumatoid arthritis with rheumatoid factor of multiple sites without organ or systems involvement: Secondary | ICD-10-CM

## 2021-09-03 DIAGNOSIS — B169 Acute hepatitis B without delta-agent and without hepatic coma: Secondary | ICD-10-CM

## 2021-09-03 DIAGNOSIS — R768 Other specified abnormal immunological findings in serum: Secondary | ICD-10-CM

## 2021-09-03 DIAGNOSIS — M79641 Pain in right hand: Secondary | ICD-10-CM

## 2021-09-04 LAB — CBC WITH DIFFERENTIAL/PLATELET
Absolute Monocytes: 181 cells/uL — ABNORMAL LOW (ref 200–950)
Basophils Absolute: 10 cells/uL (ref 0–200)
Basophils Relative: 0.2 %
Eosinophils Absolute: 0 cells/uL — ABNORMAL LOW (ref 15–500)
Eosinophils Relative: 0 %
HCT: 36.5 % (ref 35.0–45.0)
Hemoglobin: 12 g/dL (ref 11.7–15.5)
Lymphs Abs: 691 cells/uL — ABNORMAL LOW (ref 850–3900)
MCH: 27.9 pg (ref 27.0–33.0)
MCHC: 32.9 g/dL (ref 32.0–36.0)
MCV: 84.9 fL (ref 80.0–100.0)
MPV: 10.1 fL (ref 7.5–12.5)
Monocytes Relative: 3.7 %
Neutro Abs: 4018 cells/uL (ref 1500–7800)
Neutrophils Relative %: 82 %
Platelets: 300 10*3/uL (ref 140–400)
RBC: 4.3 10*6/uL (ref 3.80–5.10)
RDW: 14.1 % (ref 11.0–15.0)
Total Lymphocyte: 14.1 %
WBC: 4.9 10*3/uL (ref 3.8–10.8)

## 2021-09-04 LAB — COMPLETE METABOLIC PANEL WITH GFR
AG Ratio: 0.6 (calc) — ABNORMAL LOW (ref 1.0–2.5)
ALT: 53 U/L — ABNORMAL HIGH (ref 6–29)
AST: 181 U/L — ABNORMAL HIGH (ref 10–30)
Albumin: 3.2 g/dL — ABNORMAL LOW (ref 3.6–5.1)
Alkaline phosphatase (APISO): 108 U/L (ref 31–125)
BUN: 9 mg/dL (ref 7–25)
CO2: 22 mmol/L (ref 20–32)
Calcium: 8.2 mg/dL — ABNORMAL LOW (ref 8.6–10.2)
Chloride: 100 mmol/L (ref 98–110)
Creat: 0.61 mg/dL (ref 0.50–0.97)
Globulin: 5.5 g/dL (calc) — ABNORMAL HIGH (ref 1.9–3.7)
Glucose, Bld: 87 mg/dL (ref 65–99)
Potassium: 4.1 mmol/L (ref 3.5–5.3)
Sodium: 132 mmol/L — ABNORMAL LOW (ref 135–146)
Total Bilirubin: 0.4 mg/dL (ref 0.2–1.2)
Total Protein: 8.7 g/dL — ABNORMAL HIGH (ref 6.1–8.1)
eGFR: 123 mL/min/{1.73_m2} (ref >=60)

## 2021-09-04 LAB — SEDIMENTATION RATE: Sed Rate: >130 mm/h — ABNORMAL HIGH (ref 0–20)

## 2021-09-04 LAB — IGG, IGA, IGM
IgG (Immunoglobin G), Serum: 3853 mg/dL — ABNORMAL HIGH (ref 600–1640)
IgM, Serum: 239 mg/dL (ref 50–300)
Immunoglobulin A: 495 mg/dL — ABNORMAL HIGH (ref 47–310)

## 2021-09-11 ENCOUNTER — Other Ambulatory Visit: Payer: Self-pay

## 2021-09-11 ENCOUNTER — Ambulatory Visit: Payer: Medicaid Other | Admitting: Internal Medicine

## 2021-09-11 ENCOUNTER — Encounter: Payer: Self-pay | Admitting: Internal Medicine

## 2021-09-11 VITALS — BP 110/79 | HR 145 | Temp 98.9°F | Ht 59.0 in | Wt 104.0 lb

## 2021-09-11 DIAGNOSIS — R21 Rash and other nonspecific skin eruption: Secondary | ICD-10-CM | POA: Diagnosis not present

## 2021-09-11 DIAGNOSIS — M069 Rheumatoid arthritis, unspecified: Secondary | ICD-10-CM | POA: Insufficient documentation

## 2021-09-11 DIAGNOSIS — B188 Other chronic viral hepatitis: Secondary | ICD-10-CM | POA: Diagnosis not present

## 2021-09-11 DIAGNOSIS — D841 Defects in the complement system: Secondary | ICD-10-CM | POA: Insufficient documentation

## 2021-09-11 NOTE — Patient Instructions (Signed)
I am worried your rash is progressive, and will need a skin biopsy with dermatology evaluation  I have placed a dermatology referral to [facility] (might get in faster)   In the mean time, will repeat hepatitis testing along with other potential infection that can cause the rash.  These tests were done in hospital but often needs repeat in several weeks as they take long to change   Please see me again in 5-6 weeks

## 2021-09-11 NOTE — Progress Notes (Signed)
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   MRN: [id number]  Chief Complaint  Patient presents with   Hospitalization Follow-up    HPI:  Victoria Fitzpatrick is a 31 y.o. 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 She then developed rash on extremities that was centribital -- squamous/papular in nature. Not tender. Itchy though  Saw her in hospital hep b igm and rmsf igm positive. We suspected rmsf igm false positive. 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 We questioned if rash/arthritic changes were related to hep b  Respiratory viral pcr negative  Urine gc/chlam for a reactive reiter's process evaluation was also negative  Hiv screen and rpr was initially negative  The swelling/pain improved so discharged   ----------- 09/11/21 clinic f/u Rash worse (more proximal progression -- itchy) Ankle swelling resolved but then back  Feels well otherwise No f/c No n/v/diarrhea No jaundice No cough, chest pain, abd pain  No new medications     No Known Allergies    Outpatient Medications Prior to Visit  Medication Sig Dispense Refill   acetaminophen (TYLENOL) 325 MG  tablet Take 2 tablets (650 mg total) by mouth every 4 (four) hours as needed (for pain scale < 4). (Patient not taking: Reported on 04/24/2021) 30 tablet 0   ciprofloxacin (CIPRO) 500 MG tablet Take 1 tablet every 12 hours by oral route for 7 days. (Patient not taking: Reported on 09/11/2021)     naproxen (NAPROSYN) 500 MG tablet Take 1 tablet (500 mg total) by mouth 2 (two) times daily with a meal. (Patient not taking: Reported on 08/22/2021) 20 tablet 0   No facility-administered medications prior to visit.     Social History   Socioeconomic History   Marital status: Married    Spouse name: Duinh   Number of children: 2   Years of education: Not on file   Highest education level: Not on file  Occupational History   Not on file  Tobacco Use   Smoking status: Never    Passive exposure: Never   Smokeless tobacco: Never  Vaping Use   Vaping Use: Never used  Substance and Sexual Activity   Alcohol use: No   Drug use: No   Sexual activity: Yes    Birth control/protection: None  Other Topics Concern   Not on file  Social History Narrative   Not on file   Social Determinants of Health   Financial Resource Strain: Low Risk  (12/06/2018)   Overall Financial Resource Strain (CARDIA)    Difficulty of Paying Living Expenses: Not hard at all  Food Insecurity: No Food Insecurity (06/16/2018)   Hunger Vital Sign  Worried About Programme researcher, broadcasting/film/video in the Last Year: Never true    Ran Out of Food in the Last Year: Never true  Transportation Needs: No Transportation Needs (06/16/2018)   PRAPARE - Administrator, Civil Service (Medical): No    Lack of Transportation (Non-Medical): No  Physical Activity: Not on file  Stress: Not on file  Social Connections: Not on file  Intimate Partner Violence: Not on file      Review of Systems    All other ros negative   Objective:    BP 110/79   Pulse (!) 145   Temp 98.9 F (37.2 C) (Oral)   Ht 4\' 11"  (1.499 m)   Wt 104 lb  (47.2 kg)   BMI 21.01 kg/m  Nursing note and vital signs reviewed.  Physical Exam     General/constitutional: no distress, pleasant HEENT: Normocephalic, PER, Conj Clear, EOMI, Oropharynx clear Neck supple CV: rrr no mrg Lungs: clear to auscultation, normal respiratory effort Abd: Soft, Nontender Ext: no edema Skin/msk; squmous papular rash palms involvement also with erythema papular macular changes and some with ulceration. Ankles swelling  Neuro: nonfocal        Labs:  Micro:  Serology:  Imaging:  Assessment & Plan:   Problem List Items Addressed This Visit       Musculoskeletal and Integument   Rash   Relevant Orders   Fluorescent treponemal ab(fta)-IgG-bld   C-reactive protein   RPR   Ambulatory referral to Dermatology   COMPLETE METABOLIC PANEL WITH GFR   CBC w/Diff   HIV antibody (with reflex)   Other Visit Diagnoses     Other chronic viral hepatitis (HCC)    -  Primary   Relevant Orders   Hepatitis B surface antibody,quantitative   Hepatitis B Core Antibody, total   Hepatitis B Surface AntiGEN   Hepatitis B e antibody   Hepatitis B e antigen   Fluorescent treponemal ab(fta)-IgG-bld   C-reactive protein   RPR   Ambulatory referral to Dermatology   COMPLETE METABOLIC PANEL WITH GFR   CBC w/Diff   HIV antibody (with reflex)         No orders of the defined types were placed in this encounter.    Abx: 8/13-14 vanc   8/12 doxy   ASSESSMENT: Rash Serologic testing with positive hep b core igm   Agree she might have had acute hep b presentation. The core igm could also be false positive and she might have had nonspecific viral illness. 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    ----- 9/1 assessment Rash and joint swelling rather  concerning I am repeating syphilis, hiv, hepatitis b testing today.   Not sure if rash is related to any infection above pending testing  I consider lupus or connective tissue associated disease on ddx as well  This is not an atopic/eczema presentation  Given complexity/duration of rash, will refer to dermatology for consideration of biopsy and further evaluation. Would like to to avoid any steroid topical at this time  See me in 5-6 weeks, after derm evaluation   Follow-up: Return in about 5 weeks (around 10/16/2021).      12/16/2021, MD [facility] 09/11/2021, 11:20 AM

## 2021-09-16 ENCOUNTER — Ambulatory Visit: Payer: Medicaid Other | Admitting: Internal Medicine

## 2021-09-16 LAB — COMPLETE METABOLIC PANEL WITH GFR
AG Ratio: 0.6 (calc) — ABNORMAL LOW (ref 1.0–2.5)
ALT: 74 U/L — ABNORMAL HIGH (ref 6–29)
AST: 316 U/L — ABNORMAL HIGH (ref 10–30)
Albumin: 2.8 g/dL — ABNORMAL LOW (ref 3.6–5.1)
Alkaline phosphatase (APISO): 145 U/L — ABNORMAL HIGH (ref 31–125)
BUN: 10 mg/dL (ref 7–25)
CO2: 24 mmol/L (ref 20–32)
Calcium: 7.7 mg/dL — ABNORMAL LOW (ref 8.6–10.2)
Chloride: 106 mmol/L (ref 98–110)
Creat: 0.5 mg/dL (ref 0.50–0.97)
Globulin: 4.7 g/dL (calc) — ABNORMAL HIGH (ref 1.9–3.7)
Glucose, Bld: 118 mg/dL — ABNORMAL HIGH (ref 65–99)
Potassium: 3.7 mmol/L (ref 3.5–5.3)
Sodium: 135 mmol/L (ref 135–146)
Total Bilirubin: 0.4 mg/dL (ref 0.2–1.2)
Total Protein: 7.5 g/dL (ref 6.1–8.1)
eGFR: 129 mL/min/{1.73_m2} (ref >=60)

## 2021-09-16 LAB — HEPATITIS B SURFACE ANTIBODY, QUANTITATIVE: Hep B S AB Quant (Post): 220 m[IU]/mL (ref >=10)

## 2021-09-16 LAB — HEPATITIS B SURFACE ANTIGEN: Hepatitis B Surface Ag: NONREACTIVE

## 2021-09-16 LAB — CBC WITH DIFFERENTIAL/PLATELET
Absolute Monocytes: 99 cells/uL — ABNORMAL LOW (ref 200–950)
Basophils Absolute: 9 cells/uL (ref 0–200)
Basophils Relative: 0.3 %
Eosinophils Absolute: 0 cells/uL — ABNORMAL LOW (ref 15–500)
Eosinophils Relative: 0 %
HCT: 31.6 % — ABNORMAL LOW (ref 35.0–45.0)
Hemoglobin: 10.5 g/dL — ABNORMAL LOW (ref 11.7–15.5)
Lymphs Abs: 642 cells/uL — ABNORMAL LOW (ref 850–3900)
MCH: 27.3 pg (ref 27.0–33.0)
MCHC: 33.2 g/dL (ref 32.0–36.0)
MCV: 82.1 fL (ref 80.0–100.0)
MPV: 10 fL (ref 7.5–12.5)
Monocytes Relative: 3.3 %
Neutro Abs: 2250 cells/uL (ref 1500–7800)
Neutrophils Relative %: 75 %
Platelets: 328 10*3/uL (ref 140–400)
RBC: 3.85 10*6/uL (ref 3.80–5.10)
RDW: 14.1 % (ref 11.0–15.0)
Total Lymphocyte: 21.4 %
WBC: 3 10*3/uL — ABNORMAL LOW (ref 3.8–10.8)

## 2021-09-16 LAB — HEPATITIS B CORE ANTIBODY, TOTAL: Hep B Core Total Ab: NONREACTIVE

## 2021-09-16 LAB — HEPATITIS B E ANTIGEN: Hep B E Ag: NONREACTIVE

## 2021-09-16 LAB — HEPATITIS B E ANTIBODY: Hep B E Ab: NONREACTIVE

## 2021-09-16 LAB — C-REACTIVE PROTEIN: CRP: 3.2 mg/L (ref <8.0)

## 2021-09-16 LAB — HIV ANTIBODY (ROUTINE TESTING W REFLEX): HIV 1&2 Ab, 4th Generation: NONREACTIVE

## 2021-09-16 LAB — RPR: RPR Ser Ql: NONREACTIVE

## 2021-09-18 DIAGNOSIS — M069 Rheumatoid arthritis, unspecified: Secondary | ICD-10-CM | POA: Diagnosis not present

## 2021-09-18 DIAGNOSIS — R21 Rash and other nonspecific skin eruption: Secondary | ICD-10-CM | POA: Diagnosis not present

## 2021-09-22 ENCOUNTER — Telehealth: Payer: Self-pay

## 2021-09-22 NOTE — Telephone Encounter (Signed)
Spoke with Victoria Fitzpatrick, advised her that Dr. Renold Don would like for her to follow up with rheumatology for RA and ankle pain. Discussed that he thinks it is less likely for her rash to be related to RA.   Relayed that infection screening was negative and scheduled for follow up with Dr. Renold Don 10/11.  She has appointment with dermatology 10/9. Patient verbalized understanding and has no further questions.   Sandie Ano, RN

## 2021-09-22 NOTE — Telephone Encounter (Signed)
-----   Message from Raymondo Band, MD sent at 09/22/2021  9:44 AM EDT ----- Hi team  She continued to have the rash Her RA with ankle pain will need to be addressed for treatment with her rheumatologist  I am not certain (less likely) that the rash is associated with rheumatoid arthritis  The repeat infection screen is negative for even hepatitis B which we original thought she might have. Likely a false positive from rheumatoid arthritis  Please see if she have already made appointment with dermatology for evaluation and make sure she has follow up with [place] as well in a few weeks.   thanks

## 2021-09-23 DIAGNOSIS — M898X6 Other specified disorders of bone, lower leg: Secondary | ICD-10-CM | POA: Diagnosis not present

## 2021-09-23 DIAGNOSIS — L089 Local infection of the skin and subcutaneous tissue, unspecified: Secondary | ICD-10-CM | POA: Diagnosis not present

## 2021-09-23 DIAGNOSIS — M25572 Pain in left ankle and joints of left foot: Secondary | ICD-10-CM | POA: Diagnosis not present

## 2021-09-24 ENCOUNTER — Telehealth: Payer: Self-pay

## 2021-09-24 NOTE — Telephone Encounter (Signed)
Patient's PCP office called and said her symptoms were worsening and that she needed an urgent appointment scheduled. Appointment has been scheduled for 09/28/2021.

## 2021-09-25 NOTE — Progress Notes (Unsigned)
Office Visit Note  Patient: Victoria Fitzpatrick             Date of Birth: 09/11/1990           MRN: [id number]             PCP: Lindaann Pascal, PA-C Referring: Lindaann Pascal, PA-C Visit Date: 09/28/2021   Subjective:  No chief complaint on file.   History of Present Illness: Victoria Fitzpatrick is a 31 y.o. female here for follow up rheumatoid arthritis    Previous HPI 09/03/2021 Victoria Fitzpatrick is a 31 y.o. female here for follow up for rheumatoid arthritis on treatment with as needed NSAIDs but after recent hospitalization. She was admitted to the hospital about 2 weeks ago with fever, rashes, and ankle swelling. Lab workup showed leukopenia, highly elevated rheumatoid factor, and decreased complement C3 was also positive for hepatitis B core IgM. RMSF IgM was equivocal. She received empiric doxycycline dose without complete treatment course. She has not experienced a significant change in joint pains or swelling outside of bilateral ankles. Fevers have improved but rash remains on all extremities.   Previous HPI 04/24/2021 Victoria Fitzpatrick is a 31 y.o. female here for follow up with bilateral hand and knee pain significantly positive RF and ESR trying daily NSAID treatment. She stopped taking aleve due to not seeing any difference in her symptoms. She started trying an antiinflammatory diet and some herbal supplements and symptoms are doing well, she has not suffered much trouble in the past 2 weeks. Still stiff just a few minutes in morning times.   Previous HPI 03/19/21 Victoria Fitzpatrick is a 31 y.o. female here for evaluation of bilateral hands and knees joint pains since last year. This started last summer she does not recall any preceding illness or changes before problems started. She has had wrist pain years ago but no prior chronic issue in the current areas. Labs showed RF of 24, ESR of 51, and xrays of hands and knees were unremarkable. She took prednisone for 2 weeks with great improvement of symptoms but returned  after finishing the medication. She is taking a daily supplement joint complex containing shellfish products no other medications. Hand and wrist and knee pain bothers her during work but worst stiffness is first thing in the mornings.   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   PMFS History:  Patient Active Problem List   Diagnosis Date Noted   Rheumatoid arthritis (HCC) 09/11/2021   Hypocomplementemia (HCC) 09/11/2021   Rash    Fever 08/23/2021   SIRS (systemic inflammatory response syndrome) (HCC) 08/22/2021   Acute hepatitis B 08/22/2021   Supervision of high risk pregnancy, antepartum 06/12/2018   History of low birth weight 06/12/2018   Low grade squamous intraepithelial lesion (LGSIL) on cervical Pap smear 06/12/2018    Past Medical History:  Diagnosis Date   Medical history non-contributory    Normal labor 12/06/2018   Rheumatoid arthritis (HCC)     Family History  Problem Relation Age of Onset   Liver disease Brother    Past Surgical History:  Procedure Laterality Date   NO PAST SURGERIES     Social History   Social History Narrative   Not on file   Immunization History  Administered Date(s) Administered   Influenza,inj,Quad  PF,6+ Mos 10/10/2013, 10/12/2018   Tdap 10/31/2013, 10/12/2018     Objective: Vital Signs: There were no vitals taken for this visit.   Physical Exam   Musculoskeletal Exam: ***  CDAI Exam: CDAI Score: -- Patient Global: --; Provider Global: -- Swollen: --; Tender: -- Joint Exam 09/28/2021   No joint exam has been documented for this visit   There is currently no information documented on the homunculus. Go to the Rheumatology activity and complete the homunculus joint exam.  Investigation: No additional  findings.  Imaging: No results found.  Recent Labs: Lab Results  Component Value Date   WBC 3.0 (L) 09/11/2021   HGB 10.5 (L) 09/11/2021   PLT 328 09/11/2021   NA 135 09/11/2021   K 3.7 09/11/2021   CL 106 09/11/2021   CO2 24 09/11/2021   GLUCOSE 118 (H) 09/11/2021   BUN 10 09/11/2021   CREATININE 0.50 09/11/2021   BILITOT 0.4 09/11/2021   ALKPHOS 95 08/23/2021   AST 316 (H) 09/11/2021   ALT 74 (H) 09/11/2021   PROT 7.5 09/11/2021   ALBUMIN 2.2 (L) 08/23/2021   CALCIUM 7.7 (L) 09/11/2021   GFRAA >60 12/06/2018    Speciality Comments: No specialty comments available.  Procedures:  No procedures performed Allergies: Patient has no known allergies.   Assessment / Plan:     Visit Diagnoses: No diagnosis found.  ***  Orders: No orders of the defined types were placed in this encounter.  No orders of the defined types were placed in this encounter.    Follow-Up Instructions: No follow-ups on file.   Bertram Savin, RT  Note - This record has been created using Editor, commissioning.  Chart creation errors have been sought, but may not always  have been located. Such creation errors do not reflect on  the standard of medical care.

## 2021-09-28 ENCOUNTER — Encounter: Payer: Self-pay | Admitting: Internal Medicine

## 2021-09-28 ENCOUNTER — Ambulatory Visit: Payer: Medicaid Other | Attending: Internal Medicine | Admitting: Internal Medicine

## 2021-09-28 VITALS — BP 106/77 | HR 160 | Resp 15 | Ht 59.0 in | Wt 97.4 lb

## 2021-09-28 DIAGNOSIS — D841 Defects in the complement system: Secondary | ICD-10-CM | POA: Diagnosis not present

## 2021-09-28 DIAGNOSIS — R768 Other specified abnormal immunological findings in serum: Secondary | ICD-10-CM

## 2021-09-28 DIAGNOSIS — B169 Acute hepatitis B without delta-agent and without hepatic coma: Secondary | ICD-10-CM | POA: Diagnosis not present

## 2021-09-28 DIAGNOSIS — M0579 Rheumatoid arthritis with rheumatoid factor of multiple sites without organ or systems involvement: Secondary | ICD-10-CM

## 2021-09-28 MED ORDER — PREDNISONE 20 MG PO TABS: 40.0000 mg | ORAL_TABLET | Freq: Every day | ORAL | 0 refills | Status: AC

## 2021-10-04 LAB — RNP ANTIBODY: Ribonucleic Protein(ENA) Antibody, IgG: 1.4 AI — AB

## 2021-10-04 LAB — PROTEIN / CREATININE RATIO, URINE
Creatinine, Urine: 135 mg/dL (ref 20–275)
Protein/Creat Ratio: 667 mg/g creat — ABNORMAL HIGH (ref 24–184)
Protein/Creatinine Ratio: 0.667 mg/mg creat — ABNORMAL HIGH (ref 0.024–0.184)
Total Protein, Urine: 90 mg/dL — ABNORMAL HIGH (ref 5–24)

## 2021-10-04 LAB — ANCA SCREEN W REFLEX TITER: ANCA SCREEN: POSITIVE — AB

## 2021-10-04 LAB — ANTI-SMITH ANTIBODY: ENA SM Ab Ser-aCnc: 1.7 AI — AB

## 2021-10-04 LAB — C3 AND C4
C3 Complement: 48 mg/dL — ABNORMAL LOW (ref 83–193)
C4 Complement: 12 mg/dL — ABNORMAL LOW (ref 15–57)

## 2021-10-04 LAB — C ANCA  REFLEX TITER: C ANCA TITER: >1:640 {titer} — ABNORMAL HIGH

## 2021-10-04 LAB — ANTI-DNA ANTIBODY, DOUBLE-STRANDED: ds DNA Ab: 19 IU/mL — ABNORMAL HIGH

## 2021-10-04 LAB — CRYOGLOBULIN: Cryoglobulin, Qualitative Analysis: NOT DETECTED

## 2021-10-04 LAB — SJOGRENS SYNDROME-A EXTRACTABLE NUCLEAR ANTIBODY: SSA (Ro) (ENA) Antibody, IgG: <1 AI

## 2021-10-14 ENCOUNTER — Other Ambulatory Visit: Payer: Self-pay | Admitting: Internal Medicine

## 2021-10-14 DIAGNOSIS — M79641 Pain in right hand: Secondary | ICD-10-CM

## 2021-10-14 DIAGNOSIS — R768 Other specified abnormal immunological findings in serum: Secondary | ICD-10-CM

## 2021-10-14 MED ORDER — PREDNISONE 20 MG PO TABS: 40.0000 mg | ORAL_TABLET | Freq: Every day | ORAL | 0 refills | Status: AC

## 2021-10-21 ENCOUNTER — Ambulatory Visit: Payer: Medicaid Other | Admitting: Internal Medicine

## 2021-10-28 ENCOUNTER — Ambulatory Visit: Payer: Medicaid Other | Admitting: Internal Medicine

## 2021-10-28 ENCOUNTER — Telehealth: Payer: Self-pay

## 2021-10-28 NOTE — Telephone Encounter (Signed)
Called patient to reschedule missed appointment. No answer. Left HIPAA-compliant voicemail requesting call back. Will also send MyChart message. Need to confirm if patient has seen dermatology since prior ID appointment.  Binnie Kand, RN

## 2021-11-17 ENCOUNTER — Ambulatory Visit (INDEPENDENT_AMBULATORY_CARE_PROVIDER_SITE_OTHER): Payer: Medicaid Other | Admitting: Internal Medicine

## 2021-11-17 ENCOUNTER — Other Ambulatory Visit: Payer: Self-pay

## 2021-11-17 DIAGNOSIS — M329 Systemic lupus erythematosus, unspecified: Secondary | ICD-10-CM

## 2021-11-17 DIAGNOSIS — M069 Rheumatoid arthritis, unspecified: Secondary | ICD-10-CM

## 2021-11-17 DIAGNOSIS — R21 Rash and other nonspecific skin eruption: Secondary | ICD-10-CM | POA: Diagnosis not present

## 2021-11-17 NOTE — Progress Notes (Signed)
[facility]  Patient Active Problem List   Diagnosis Date Noted   Positive ANA (antinuclear antibody) 09/28/2021   Rheumatoid arthritis (HCC) 09/11/2021   Hypocomplementemia (HCC) 09/11/2021   Rash    Fever 08/23/2021   SIRS (systemic inflammatory response syndrome) (HCC) 08/22/2021   Acute hepatitis B 08/22/2021   Supervision of high risk pregnancy, antepartum 06/12/2018   History of low birth weight 06/12/2018   Low grade squamous intraepithelial lesion (LGSIL) on cervical Pap smear 06/12/2018      Subjective:    Patient ID: Victoria Fitzpatrick, female    DOB: January 09, 1991, 31 y.o.   MRN: [id number]  No chief complaint on file.   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 She then developed rash on extremities that was centribital -- squamous/papular in nature. Not tender. Itchy though  Saw her in hospital hep b igm and rmsf igm positive. We suspected rmsf igm false positive. 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 We questioned if rash/arthritic changes were related to hep b  Respiratory viral pcr negative  Urine gc/chlam for a reactive reiter's process evaluation was also negative  Hiv screen and rpr was initially negative  The swelling/pain improved so discharged   ----------- 09/11/21 clinic f/u Rash worse (more proximal progression -- itchy) Ankle swelling resolved but then back  Feels well otherwise No f/c No n/v/diarrhea No jaundice No cough, chest pain, abd pain  No new medications   11/17/21 id clinic f/u Patient recently present herself to [facility] as she couldn't wait for outpatient slow workup Derm/rheum saw.  Diagnosed her with lupus and think rash is lupus or rheumatologic related She was placed on pred, mmf, plaquenil with pjp bactrim prophy along with kenalog cream She said rash is better She'll f/u with [facility]  Reviewed careeverywhere  No Known Allergies    Outpatient Medications Prior to Visit  Medication Sig Dispense Refill   acetaminophen (TYLENOL) 325 MG tablet Take 2 tablets (650 mg total) by mouth every 4 (four) hours as needed (for pain scale < 4). (Patient not taking: Reported on 04/24/2021) 30 tablet 0   doxycycline (VIBRA-TABS) 100 MG tablet Take 1 tablet twice a day by oral route for 10 days.     No facility-administered medications prior to visit.     Social History   Socioeconomic History   Marital status: Married    Spouse name: Duinh   Number of children: 2   Years of education: Not on file   Highest education level: Not on file  Occupational History   Not on file  Tobacco Use   Smoking status: Never    Passive exposure: Never   Smokeless tobacco: Never  Vaping Use   Vaping Use: Never used  Substance and Sexual Activity   Alcohol use: No   Drug use: No   Sexual activity: Yes    Birth control/protection: None  Other Topics Concern   Not on file  Social History Narrative   Not on file   Social Determinants of Health   Financial Resource Strain: Low Risk  (12/06/2018)   Overall Financial Resource Strain (CARDIA)  Difficulty of Paying Living Expenses: Not hard at all  Food Insecurity: No Food Insecurity (06/16/2018)   Hunger Vital Sign    Worried About Running Out of Food in the Last Year: Never true    Ran Out of Food in the Last Year: Never true  Transportation Needs: No Transportation Needs (06/16/2018)   PRAPARE - Hydrologist (Medical): No    Lack of Transportation (Non-Medical): No  Physical Activity: Not on file  Stress: Not on file  Social Connections: Not on file  Intimate Partner Violence: Not on  file      Review of Systems    All other ros negative   Objective:    There were no vitals taken for this visit. Nursing note and vital signs reviewed.  Physical Exam     Phone visit today        Labs:  Micro:  Serology:  Imaging:  Assessment & Plan:   Problem List Items Addressed This Visit       Musculoskeletal and Integument   Rash   Rheumatoid arthritis ([place]) - Primary   Other Visit Diagnoses     Lupus ([place])            No orders of the defined types were placed in this encounter.      ASSESSMENT: Rash Serologic testing with positive hep b core igm Lupus/ra   Initially false positive hep b core igm, repeated negative. Syphilis testing and hiv testing negative  Now preponderance polyarthralgia/arthritis and rash likely ra/lupus [facility] rheum/derm evaluated 11/03/21  On multiple immunosuppressants now along with pjp prophy  Continue to follow rheum No need for further id f/u    Follow-up: No follow-ups on file.    I verified that I was speaking with the correct person using two identifiers. Due to nature of visit/patient's request, this service was provided via telemedicine using audio/visual media.  phone visit only The patient was located at home. The provider was located in the office. The patient did consent to this visit and is aware of charges through their insurance as well as the limitations of evaluation and management by telemedicine. Other persons participating in this telemedicine service were none. Time spent on visit was greater than 15 minutes on media and in coordination of care    Jabier Mutton, [facility] 11/17/2021, 2:05 PM

## 2021-12-01 ENCOUNTER — Ambulatory Visit: Payer: Medicaid Other | Attending: Internal Medicine | Admitting: Internal Medicine

## 2021-12-01 NOTE — Progress Notes (Deleted)
   Office Visit Note  Patient: Victoria Fitzpatrick             Date of Birth: 1990/04/03           MRN: [id number]             PCP: Lindaann Pascal, PA-C Referring: Lindaann Pascal, PA-C Visit Date: 12/01/2021   Subjective:  No chief complaint on file.   History of Present Illness: Victoria Fitzpatrick is a 31 y.o. female here for follow up ***   Previous HPI    No Rheumatology ROS completed.   PMFS History:  Patient Active Problem List   Diagnosis Date Noted   Positive ANA (antinuclear antibody) 09/28/2021   Rheumatoid arthritis (HCC) 09/11/2021   Hypocomplementemia (HCC) 09/11/2021   Rash    Fever 08/23/2021   SIRS (systemic inflammatory response syndrome) (HCC) 08/22/2021   Acute hepatitis B 08/22/2021   Supervision of high risk pregnancy, antepartum 06/12/2018   History of low birth weight 06/12/2018   Low grade squamous intraepithelial lesion (LGSIL) on cervical Pap smear 06/12/2018    Past Medical History:  Diagnosis Date   Medical history non-contributory    Normal labor 12/06/2018   Rheumatoid arthritis (HCC)     Family History  Problem Relation Age of Onset   Liver disease Brother    Past Surgical History:  Procedure Laterality Date   NO PAST SURGERIES     Social History   Social History Narrative   Not on file   Immunization History  Administered Date(s) Administered   Influenza,inj,Quad PF,6+ Mos 10/10/2013, 10/12/2018, 11/12/2021   PNEUMOCOCCAL CONJUGATE-20 11/13/2021   Tdap 10/31/2013, 10/12/2018     Objective: Vital Signs: There were no vitals taken for this visit.   Physical Exam   Musculoskeletal Exam: ***  CDAI Exam: CDAI Score: -- Patient Global: --; Provider Global: -- Swollen: --; Tender: -- Joint Exam 12/01/2021   No joint exam has been documented for this visit   There is currently no information documented on the homunculus. Go to the Rheumatology activity and complete the homunculus joint exam.  Investigation: No additional  findings.  Imaging: No results found.  Recent Labs: Lab Results  Component Value Date   WBC 3.0 (L) 09/11/2021   HGB 10.5 (L) 09/11/2021   PLT 328 09/11/2021   NA 135 09/11/2021   K 3.7 09/11/2021   CL 106 09/11/2021   CO2 24 09/11/2021   GLUCOSE 118 (H) 09/11/2021   BUN 10 09/11/2021   CREATININE 0.50 09/11/2021   BILITOT 0.4 09/11/2021   ALKPHOS 95 08/23/2021   AST 316 (H) 09/11/2021   ALT 74 (H) 09/11/2021   PROT 7.5 09/11/2021   ALBUMIN 2.2 (L) 08/23/2021   CALCIUM 7.7 (L) 09/11/2021   GFRAA >60 12/06/2018    Speciality Comments: No specialty comments available.  Procedures:  No procedures performed Allergies: Patient has no known allergies.   Assessment / Plan:     Visit Diagnoses: No diagnosis found.  ***  Orders: No orders of the defined types were placed in this encounter.  No orders of the defined types were placed in this encounter.    Follow-Up Instructions: No follow-ups on file.   Fuller Plan, MD  Note - This record has been created using [employer].  Chart creation errors have been sought, but may not always  have been located. Such creation errors do not reflect on  the standard of medical care.

## 2021-12-21 DIAGNOSIS — M3213 Lung involvement in systemic lupus erythematosus: Secondary | ICD-10-CM | POA: Diagnosis not present

## 2022-01-01 DIAGNOSIS — Z79899 Other long term (current) drug therapy: Secondary | ICD-10-CM | POA: Diagnosis not present

## 2022-01-01 DIAGNOSIS — H04123 Dry eye syndrome of bilateral lacrimal glands: Secondary | ICD-10-CM | POA: Diagnosis not present

## 2022-01-01 DIAGNOSIS — H3554 Dystrophies primarily involving the retinal pigment epithelium: Secondary | ICD-10-CM | POA: Diagnosis not present

## 2022-01-01 DIAGNOSIS — H4423 Degenerative myopia, bilateral: Secondary | ICD-10-CM | POA: Diagnosis not present

## 2022-01-13 DIAGNOSIS — Z30017 Encounter for initial prescription of implantable subdermal contraceptive: Secondary | ICD-10-CM | POA: Diagnosis not present

## 2022-01-19 DIAGNOSIS — M3213 Lung involvement in systemic lupus erythematosus: Secondary | ICD-10-CM | POA: Diagnosis not present

## 2022-02-16 DIAGNOSIS — M3213 Lung involvement in systemic lupus erythematosus: Secondary | ICD-10-CM | POA: Diagnosis not present

## 2022-02-24 DIAGNOSIS — R7989 Other specified abnormal findings of blood chemistry: Secondary | ICD-10-CM | POA: Diagnosis not present

## 2022-03-11 DIAGNOSIS — M328 Other forms of systemic lupus erythematosus: Secondary | ICD-10-CM | POA: Diagnosis not present

## 2022-03-11 DIAGNOSIS — Z79899 Other long term (current) drug therapy: Secondary | ICD-10-CM | POA: Diagnosis not present

## 2022-03-11 DIAGNOSIS — J8489 Other specified interstitial pulmonary diseases: Secondary | ICD-10-CM | POA: Diagnosis not present

## 2022-03-16 DIAGNOSIS — M3213 Lung involvement in systemic lupus erythematosus: Secondary | ICD-10-CM | POA: Diagnosis not present

## 2022-03-24 DIAGNOSIS — J8489 Other specified interstitial pulmonary diseases: Secondary | ICD-10-CM | POA: Diagnosis not present

## 2022-03-24 DIAGNOSIS — M954 Acquired deformity of chest and rib: Secondary | ICD-10-CM | POA: Diagnosis not present

## 2022-03-24 DIAGNOSIS — M328 Other forms of systemic lupus erythematosus: Secondary | ICD-10-CM | POA: Diagnosis not present

## 2022-04-13 DIAGNOSIS — M3213 Lung involvement in systemic lupus erythematosus: Secondary | ICD-10-CM | POA: Diagnosis not present

## 2022-04-22 DIAGNOSIS — M328 Other forms of systemic lupus erythematosus: Secondary | ICD-10-CM | POA: Diagnosis not present

## 2022-04-22 DIAGNOSIS — Z7952 Long term (current) use of systemic steroids: Secondary | ICD-10-CM | POA: Diagnosis not present

## 2022-04-22 DIAGNOSIS — J8489 Other specified interstitial pulmonary diseases: Secondary | ICD-10-CM | POA: Diagnosis not present

## 2022-05-11 DIAGNOSIS — R87613 High grade squamous intraepithelial lesion on cytologic smear of cervix (HGSIL): Secondary | ICD-10-CM | POA: Diagnosis not present

## 2022-05-11 DIAGNOSIS — Z01411 Encounter for gynecological examination (general) (routine) with abnormal findings: Secondary | ICD-10-CM | POA: Diagnosis not present

## 2022-05-11 DIAGNOSIS — Z01419 Encounter for gynecological examination (general) (routine) without abnormal findings: Secondary | ICD-10-CM | POA: Diagnosis not present

## 2022-05-12 DIAGNOSIS — M3213 Lung involvement in systemic lupus erythematosus: Secondary | ICD-10-CM | POA: Diagnosis not present

## 2022-05-12 DIAGNOSIS — Z5111 Encounter for antineoplastic chemotherapy: Secondary | ICD-10-CM | POA: Diagnosis not present

## 2022-06-16 DIAGNOSIS — Z5112 Encounter for antineoplastic immunotherapy: Secondary | ICD-10-CM | POA: Diagnosis not present

## 2022-06-16 DIAGNOSIS — M3213 Lung involvement in systemic lupus erythematosus: Secondary | ICD-10-CM | POA: Diagnosis not present

## 2022-06-28 DIAGNOSIS — E041 Nontoxic single thyroid nodule: Secondary | ICD-10-CM | POA: Diagnosis not present

## 2022-07-14 DIAGNOSIS — Z5112 Encounter for antineoplastic immunotherapy: Secondary | ICD-10-CM | POA: Diagnosis not present

## 2022-07-14 DIAGNOSIS — M3213 Lung involvement in systemic lupus erythematosus: Secondary | ICD-10-CM | POA: Diagnosis not present

## 2022-07-28 DIAGNOSIS — Z92241 Personal history of systemic steroid therapy: Secondary | ICD-10-CM | POA: Diagnosis not present

## 2022-07-28 DIAGNOSIS — M328 Other forms of systemic lupus erythematosus: Secondary | ICD-10-CM | POA: Diagnosis not present

## 2022-07-28 DIAGNOSIS — E559 Vitamin D deficiency, unspecified: Secondary | ICD-10-CM | POA: Diagnosis not present

## 2022-07-28 DIAGNOSIS — Z23 Encounter for immunization: Secondary | ICD-10-CM | POA: Diagnosis not present

## 2022-07-28 DIAGNOSIS — Z79899 Other long term (current) drug therapy: Secondary | ICD-10-CM | POA: Diagnosis not present

## 2022-08-11 DIAGNOSIS — M3213 Lung involvement in systemic lupus erythematosus: Secondary | ICD-10-CM | POA: Diagnosis not present

## 2022-08-28 IMAGING — CT CT ABD-PELV W/ CM
2 of 4 series · 16 of 46 positions shown, 18 images · IV contrast (APPLIED)
Comparison: None Available.

CLINICAL DATA: Left lower quadrant abdominal pain.

EXAM:
CT ABDOMEN AND PELVIS WITH CONTRAST
TECHNIQUE: Multidetector CT imaging of the abdomen and pelvis was performed
using the standard protocol following bolus administration of
intravenous contrast.

[Series 3: abd/ pelvis 5.0 i30f 2 · axial · 0.68mm/px · z∈[+773,+1138]mm · 13 of 81 slices shown, 15 images]
[im 4/81  soft-tissue]
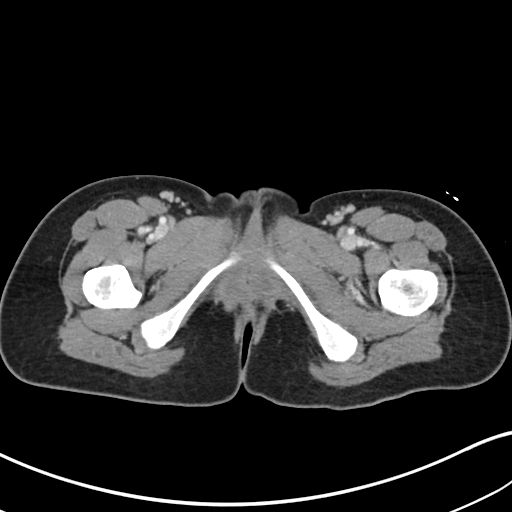
[im 4/81  bone]
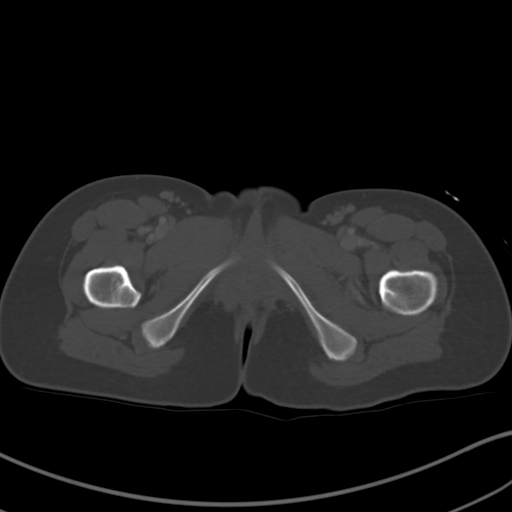
[im 11/81  soft-tissue]
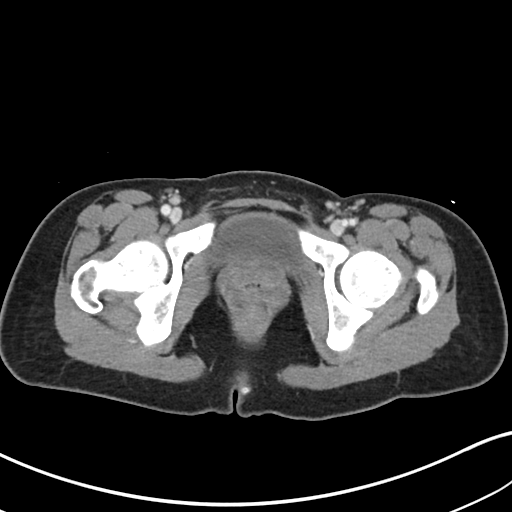
[im 17/81  soft-tissue]
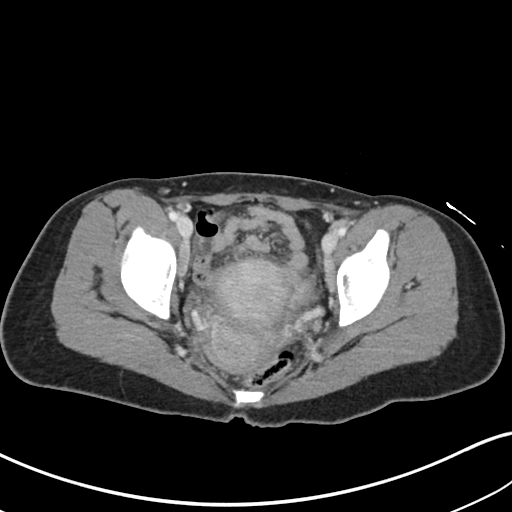
[im 24/81  soft-tissue]
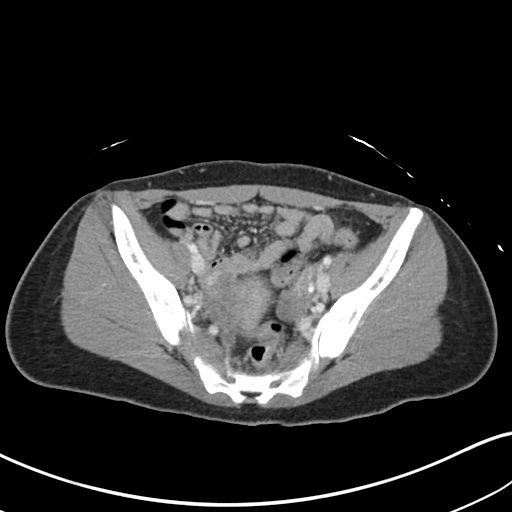
[im 27/81  soft-tissue]
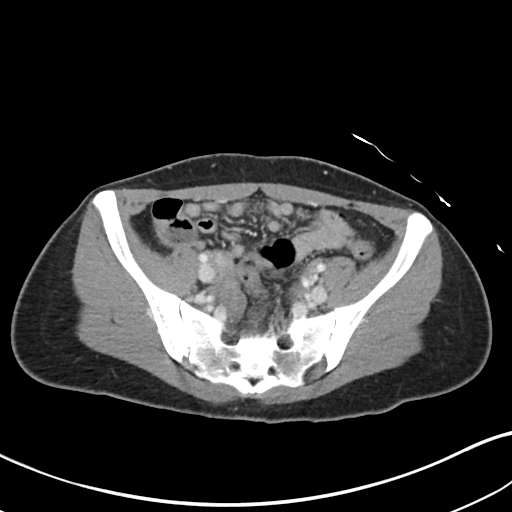
[im 34/81  soft-tissue]
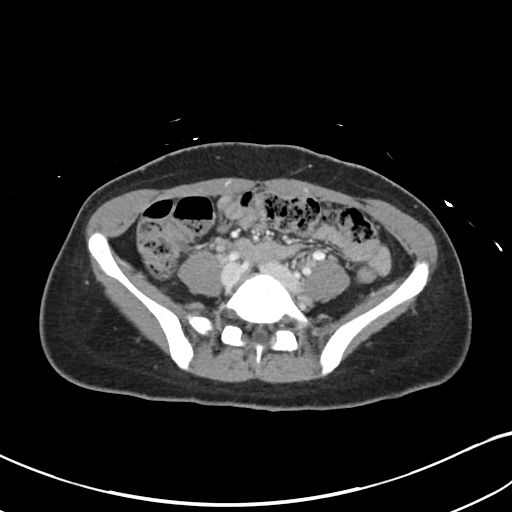
[im 41/81  soft-tissue]
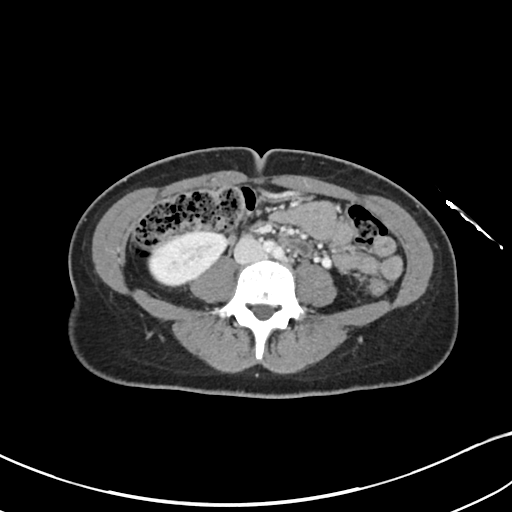
[im 47/81  soft-tissue]
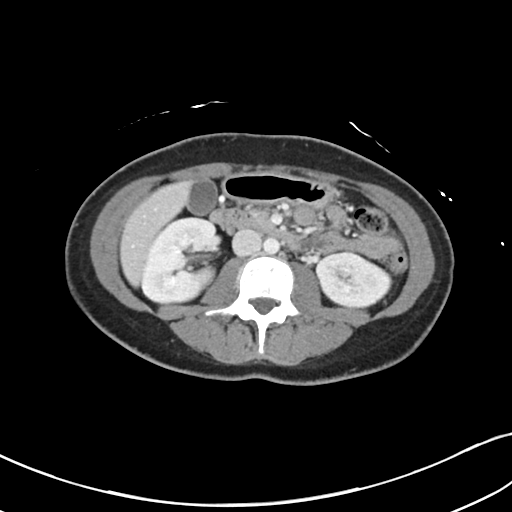
[im 54/81  soft-tissue]
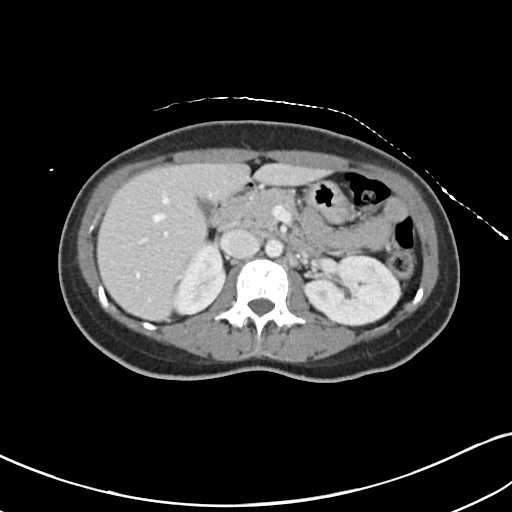
[im 54/81  bone]
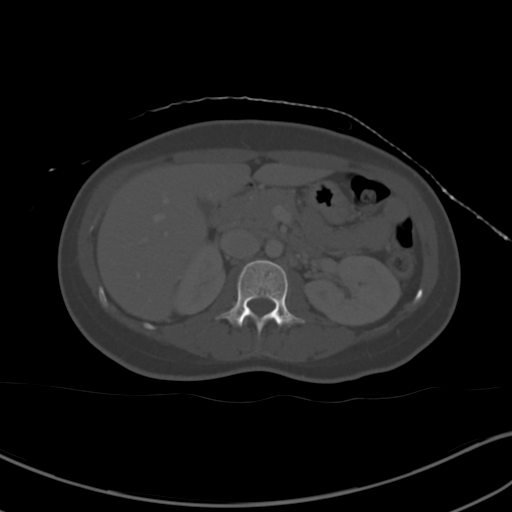
[im 57/81  soft-tissue]
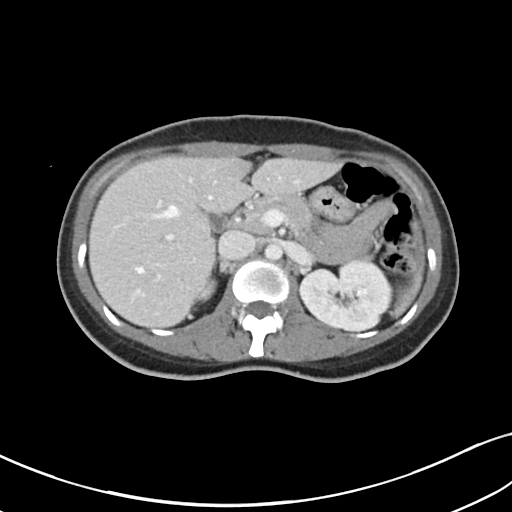
[im 64/81  soft-tissue]
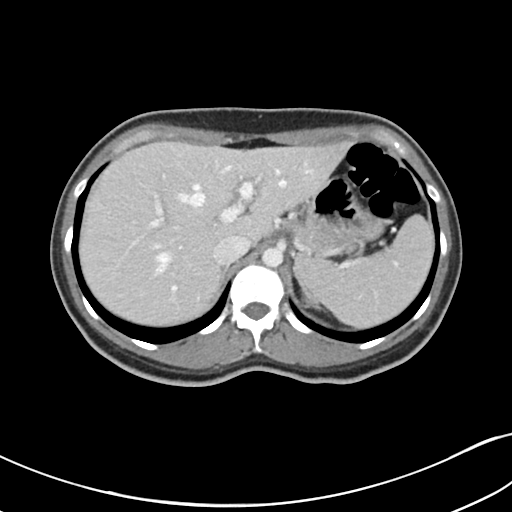
[im 71/81  soft-tissue]
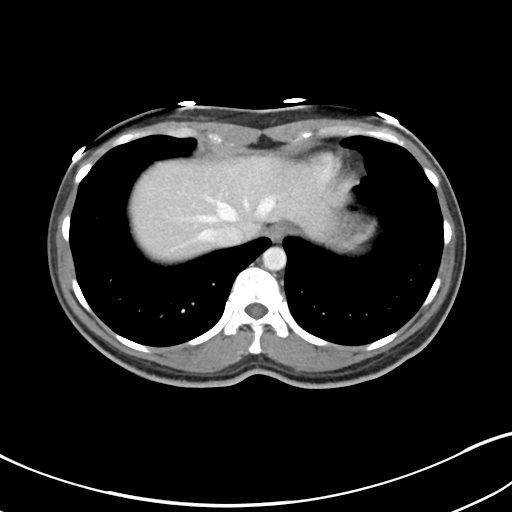
[im 77/81  soft-tissue]
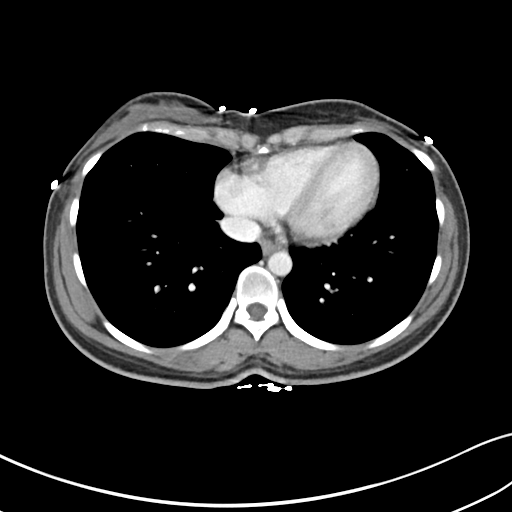

[Series 6: coronal soft tissue · coronal · 0.62mm/px · 3 of 101 slices shown]
[im 34/101  soft-tissue]
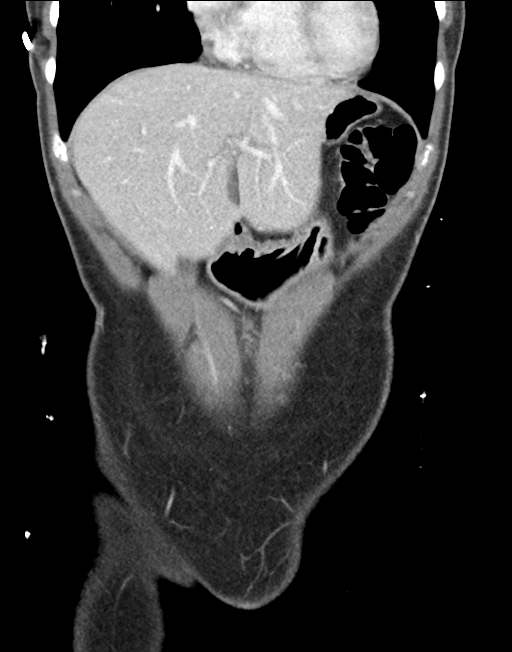
[im 45/101  soft-tissue]
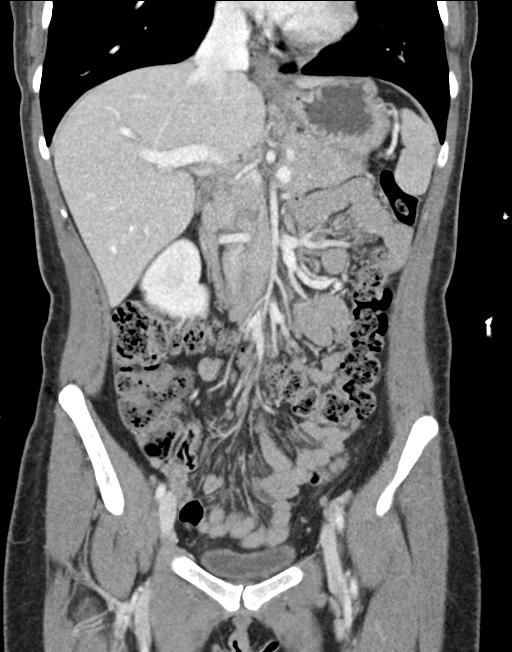
[im 56/101  soft-tissue]
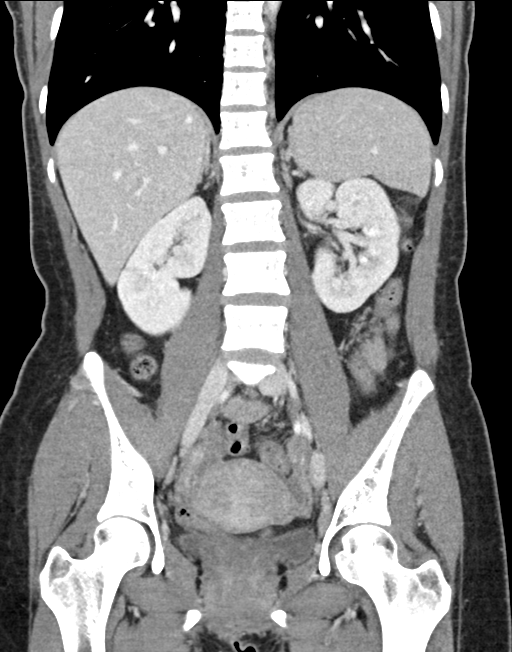

[16 of 46 positions shown; findings below may reference images not displayed]

RADIATION DOSE REDUCTION: This exam was performed according to the
departmental dose-optimization program which includes automated
exposure control, adjustment of the mA and/or kV according to
patient size and/or use of iterative reconstruction technique.

CONTRAST:  100mL OMNIPAQUE IOHEXOL 300 MG/ML  SOLN
FINDINGS: Lower chest: No acute abnormality.

Hepatobiliary: No focal liver abnormality is seen. No gallstones,
gallbladder wall thickening, or biliary dilatation.

Pancreas: Unremarkable. No pancreatic ductal dilatation or
surrounding inflammatory changes.

Spleen: Normal in size without focal abnormality.

Adrenals/Urinary Tract: Adrenal glands are unremarkable. Kidneys are
normal, without renal calculi, focal lesion, or hydronephrosis.
Bladder is unremarkable.

Stomach/Bowel: Stomach is within normal limits. Appendix appears
normal. No evidence of bowel wall thickening, distention, or
inflammatory changes.

Vascular/Lymphatic: No significant vascular findings are present. No
enlarged abdominal or pelvic lymph nodes.

Reproductive: Uterus and bilateral adnexa are unremarkable.

Other: No abdominal wall hernia or abnormality. No abdominopelvic
ascites.

Musculoskeletal: No acute or significant osseous findings.
IMPRESSION: No definite abnormality seen in the abdomen or pelvis.

## 2022-09-01 DIAGNOSIS — R87613 High grade squamous intraepithelial lesion on cytologic smear of cervix (HGSIL): Secondary | ICD-10-CM | POA: Diagnosis not present

## 2022-09-08 DIAGNOSIS — M3213 Lung involvement in systemic lupus erythematosus: Secondary | ICD-10-CM | POA: Diagnosis not present

## 2022-10-06 DIAGNOSIS — M3213 Lung involvement in systemic lupus erythematosus: Secondary | ICD-10-CM | POA: Diagnosis not present

## 2022-11-03 DIAGNOSIS — Z23 Encounter for immunization: Secondary | ICD-10-CM | POA: Diagnosis not present

## 2022-11-03 DIAGNOSIS — E559 Vitamin D deficiency, unspecified: Secondary | ICD-10-CM | POA: Diagnosis not present

## 2022-11-03 DIAGNOSIS — M328 Other forms of systemic lupus erythematosus: Secondary | ICD-10-CM | POA: Diagnosis not present

## 2022-11-03 DIAGNOSIS — Z79899 Other long term (current) drug therapy: Secondary | ICD-10-CM | POA: Diagnosis not present

## 2022-11-10 DIAGNOSIS — M3213 Lung involvement in systemic lupus erythematosus: Secondary | ICD-10-CM | POA: Diagnosis not present

## 2022-12-22 DIAGNOSIS — M3213 Lung involvement in systemic lupus erythematosus: Secondary | ICD-10-CM | POA: Diagnosis not present

## 2022-12-30 DIAGNOSIS — H5213 Myopia, bilateral: Secondary | ICD-10-CM | POA: Diagnosis not present

## 2023-01-19 DIAGNOSIS — M3213 Lung involvement in systemic lupus erythematosus: Secondary | ICD-10-CM | POA: Diagnosis not present

## 2023-03-01 DIAGNOSIS — M3213 Lung involvement in systemic lupus erythematosus: Secondary | ICD-10-CM | POA: Diagnosis not present

## 2023-03-09 DIAGNOSIS — Z79899 Other long term (current) drug therapy: Secondary | ICD-10-CM | POA: Diagnosis not present

## 2023-03-09 DIAGNOSIS — M328 Other forms of systemic lupus erythematosus: Secondary | ICD-10-CM | POA: Diagnosis not present

## 2023-04-06 DIAGNOSIS — Z79899 Other long term (current) drug therapy: Secondary | ICD-10-CM | POA: Diagnosis not present

## 2023-04-06 DIAGNOSIS — M3213 Lung involvement in systemic lupus erythematosus: Secondary | ICD-10-CM | POA: Diagnosis not present

## 2023-05-18 DIAGNOSIS — M328 Other forms of systemic lupus erythematosus: Secondary | ICD-10-CM | POA: Diagnosis not present

## 2023-05-18 DIAGNOSIS — Z79899 Other long term (current) drug therapy: Secondary | ICD-10-CM | POA: Diagnosis not present

## 2023-06-15 DIAGNOSIS — M3213 Lung involvement in systemic lupus erythematosus: Secondary | ICD-10-CM | POA: Diagnosis not present

## 2023-06-15 DIAGNOSIS — Z7969 Long term (current) use of other immunomodulators and immunosuppressants: Secondary | ICD-10-CM | POA: Diagnosis not present

## 2023-11-17 DIAGNOSIS — Z79899 Other long term (current) drug therapy: Secondary | ICD-10-CM | POA: Diagnosis not present

## 2023-11-17 DIAGNOSIS — Z23 Encounter for immunization: Secondary | ICD-10-CM | POA: Diagnosis not present

## 2023-11-17 DIAGNOSIS — M329 Systemic lupus erythematosus, unspecified: Secondary | ICD-10-CM | POA: Diagnosis not present
